# Patient Record
Sex: Male | Born: 1940 | Race: White | Hispanic: No | Smoking: Never smoker
Health system: Southern US, Community
[De-identification: ages and names within clinical notes are randomized; demographics above are authoritative.]

## PROBLEM LIST (undated history)

## (undated) DIAGNOSIS — I10 Essential (primary) hypertension: Secondary | ICD-10-CM

## (undated) DIAGNOSIS — M5030 Other cervical disc degeneration, unspecified cervical region: Secondary | ICD-10-CM

## (undated) DIAGNOSIS — Z955 Presence of coronary angioplasty implant and graft: Secondary | ICD-10-CM

## (undated) DIAGNOSIS — I471 Supraventricular tachycardia, unspecified: Secondary | ICD-10-CM

## (undated) DIAGNOSIS — R7303 Prediabetes: Secondary | ICD-10-CM

## (undated) DIAGNOSIS — E785 Hyperlipidemia, unspecified: Secondary | ICD-10-CM

## (undated) DIAGNOSIS — M109 Gout, unspecified: Secondary | ICD-10-CM

## (undated) DIAGNOSIS — G629 Polyneuropathy, unspecified: Secondary | ICD-10-CM

## (undated) DIAGNOSIS — M5135 Other intervertebral disc degeneration, thoracolumbar region: Secondary | ICD-10-CM

## (undated) DIAGNOSIS — K219 Gastro-esophageal reflux disease without esophagitis: Secondary | ICD-10-CM

## (undated) DIAGNOSIS — I6523 Occlusion and stenosis of bilateral carotid arteries: Secondary | ICD-10-CM

## (undated) DIAGNOSIS — R7989 Other specified abnormal findings of blood chemistry: Secondary | ICD-10-CM

## (undated) DIAGNOSIS — I2510 Atherosclerotic heart disease of native coronary artery without angina pectoris: Secondary | ICD-10-CM

## (undated) DIAGNOSIS — F0390 Unspecified dementia without behavioral disturbance: Secondary | ICD-10-CM

## (undated) DIAGNOSIS — G608 Other hereditary and idiopathic neuropathies: Secondary | ICD-10-CM

## (undated) DIAGNOSIS — I509 Heart failure, unspecified: Secondary | ICD-10-CM

## (undated) DIAGNOSIS — R270 Ataxia, unspecified: Secondary | ICD-10-CM

## (undated) DIAGNOSIS — Z79899 Other long term (current) drug therapy: Secondary | ICD-10-CM

## (undated) DIAGNOSIS — I5020 Unspecified systolic (congestive) heart failure: Secondary | ICD-10-CM

## (undated) DIAGNOSIS — N400 Enlarged prostate without lower urinary tract symptoms: Secondary | ICD-10-CM

## (undated) DIAGNOSIS — H409 Unspecified glaucoma: Secondary | ICD-10-CM

## (undated) DIAGNOSIS — I700 Atherosclerosis of aorta: Secondary | ICD-10-CM

## (undated) DIAGNOSIS — M1990 Unspecified osteoarthritis, unspecified site: Secondary | ICD-10-CM

## (undated) DIAGNOSIS — R600 Localized edema: Secondary | ICD-10-CM

## (undated) DIAGNOSIS — Z87828 Personal history of other (healed) physical injury and trauma: Secondary | ICD-10-CM

## (undated) DIAGNOSIS — K760 Fatty (change of) liver, not elsewhere classified: Secondary | ICD-10-CM

## (undated) DIAGNOSIS — Z789 Other specified health status: Secondary | ICD-10-CM

## (undated) DIAGNOSIS — K5790 Diverticulosis of intestine, part unspecified, without perforation or abscess without bleeding: Secondary | ICD-10-CM

## (undated) DIAGNOSIS — Z7901 Long term (current) use of anticoagulants: Secondary | ICD-10-CM

## (undated) DIAGNOSIS — I480 Paroxysmal atrial fibrillation: Secondary | ICD-10-CM

## (undated) DIAGNOSIS — K810 Acute cholecystitis: Secondary | ICD-10-CM

## (undated) DIAGNOSIS — I259 Chronic ischemic heart disease, unspecified: Secondary | ICD-10-CM

## (undated) HISTORY — PX: CORONARY STENT PLACEMENT: SHX1402

## (undated) HISTORY — PX: EYE SURGERY: SHX253

## (undated) HISTORY — PX: UPPER GI ENDOSCOPY: SHX6162

## (undated) HISTORY — PX: COLONOSCOPY: SHX174

## (undated) HISTORY — PX: MINOR HEMORRHOIDECTOMY: SHX6238

---

## 1968-05-01 DIAGNOSIS — Z87828 Personal history of other (healed) physical injury and trauma: Secondary | ICD-10-CM

## 1968-05-01 HISTORY — DX: Personal history of other (healed) physical injury and trauma: Z87.828

## 2003-03-05 ENCOUNTER — Other Ambulatory Visit: Payer: Self-pay

## 2004-05-01 DIAGNOSIS — Z9841 Cataract extraction status, right eye: Secondary | ICD-10-CM

## 2004-05-01 HISTORY — PX: CATARACT EXTRACTION: SUR2

## 2004-05-01 HISTORY — DX: Cataract extraction status, right eye: Z98.41

## 2005-03-09 ENCOUNTER — Ambulatory Visit: Payer: Self-pay | Admitting: Gastroenterology

## 2005-04-06 ENCOUNTER — Inpatient Hospital Stay: Payer: Self-pay | Admitting: Internal Medicine

## 2005-04-06 DIAGNOSIS — I2510 Atherosclerotic heart disease of native coronary artery without angina pectoris: Secondary | ICD-10-CM

## 2005-04-06 HISTORY — PX: LEFT HEART CATH AND CORONARY ANGIOGRAPHY: CATH118249

## 2005-04-06 HISTORY — DX: Atherosclerotic heart disease of native coronary artery without angina pectoris: I25.10

## 2005-04-07 ENCOUNTER — Other Ambulatory Visit: Payer: Self-pay

## 2005-04-07 HISTORY — PX: CORONARY ANGIOPLASTY WITH STENT PLACEMENT: SHX49

## 2005-05-01 HISTORY — PX: CORONARY STENT PLACEMENT: SHX1402

## 2005-06-06 ENCOUNTER — Encounter: Payer: Self-pay | Admitting: Internal Medicine

## 2006-04-17 ENCOUNTER — Ambulatory Visit: Payer: Self-pay | Admitting: Gastroenterology

## 2007-12-18 ENCOUNTER — Emergency Department: Payer: Self-pay | Admitting: Emergency Medicine

## 2007-12-18 ENCOUNTER — Other Ambulatory Visit: Payer: Self-pay

## 2009-07-15 ENCOUNTER — Ambulatory Visit: Payer: Self-pay | Admitting: Gastroenterology

## 2009-10-28 ENCOUNTER — Ambulatory Visit: Payer: Self-pay | Admitting: Internal Medicine

## 2009-12-21 ENCOUNTER — Observation Stay: Payer: Self-pay | Admitting: Internal Medicine

## 2009-12-29 ENCOUNTER — Ambulatory Visit: Payer: Self-pay | Admitting: Cardiovascular Disease

## 2010-02-28 ENCOUNTER — Ambulatory Visit: Payer: Self-pay

## 2010-03-21 ENCOUNTER — Ambulatory Visit: Payer: Self-pay | Admitting: Unknown Physician Specialty

## 2010-03-31 ENCOUNTER — Ambulatory Visit: Payer: Self-pay | Admitting: Unknown Physician Specialty

## 2010-03-31 ENCOUNTER — Emergency Department: Payer: Self-pay | Admitting: Emergency Medicine

## 2010-12-18 ENCOUNTER — Emergency Department: Payer: Self-pay | Admitting: Emergency Medicine

## 2010-12-22 ENCOUNTER — Emergency Department: Payer: Self-pay | Admitting: Emergency Medicine

## 2012-05-01 HISTORY — PX: JOINT REPLACEMENT: SHX530

## 2012-05-01 HISTORY — PX: TOTAL KNEE ARTHROPLASTY: SHX125

## 2013-01-06 ENCOUNTER — Emergency Department (HOSPITAL_BASED_OUTPATIENT_CLINIC_OR_DEPARTMENT_OTHER): Payer: Medicare Other

## 2013-01-06 ENCOUNTER — Emergency Department (HOSPITAL_BASED_OUTPATIENT_CLINIC_OR_DEPARTMENT_OTHER)
Admission: EM | Admit: 2013-01-06 | Discharge: 2013-01-06 | Disposition: A | Discharge status: Home / Self Care | Payer: Medicare Other | Attending: Emergency Medicine | Admitting: Emergency Medicine

## 2013-01-06 ENCOUNTER — Encounter (HOSPITAL_BASED_OUTPATIENT_CLINIC_OR_DEPARTMENT_OTHER): Payer: Self-pay | Admitting: *Deleted

## 2013-01-06 DIAGNOSIS — Y9389 Activity, other specified: Secondary | ICD-10-CM | POA: Insufficient documentation

## 2013-01-06 DIAGNOSIS — E785 Hyperlipidemia, unspecified: Secondary | ICD-10-CM | POA: Insufficient documentation

## 2013-01-06 DIAGNOSIS — Z7902 Long term (current) use of antithrombotics/antiplatelets: Secondary | ICD-10-CM | POA: Insufficient documentation

## 2013-01-06 DIAGNOSIS — Y9229 Other specified public building as the place of occurrence of the external cause: Secondary | ICD-10-CM | POA: Insufficient documentation

## 2013-01-06 DIAGNOSIS — I10 Essential (primary) hypertension: Secondary | ICD-10-CM | POA: Insufficient documentation

## 2013-01-06 DIAGNOSIS — Z79899 Other long term (current) drug therapy: Secondary | ICD-10-CM | POA: Insufficient documentation

## 2013-01-06 DIAGNOSIS — S0101XA Laceration without foreign body of scalp, initial encounter: Secondary | ICD-10-CM

## 2013-01-06 DIAGNOSIS — W1809XA Striking against other object with subsequent fall, initial encounter: Secondary | ICD-10-CM | POA: Insufficient documentation

## 2013-01-06 DIAGNOSIS — S0100XA Unspecified open wound of scalp, initial encounter: Secondary | ICD-10-CM | POA: Insufficient documentation

## 2013-01-06 DIAGNOSIS — Z9861 Coronary angioplasty status: Secondary | ICD-10-CM | POA: Insufficient documentation

## 2013-01-06 DIAGNOSIS — S0990XA Unspecified injury of head, initial encounter: Secondary | ICD-10-CM | POA: Insufficient documentation

## 2013-01-06 HISTORY — DX: Hyperlipidemia, unspecified: E78.5

## 2013-01-06 HISTORY — DX: Essential (primary) hypertension: I10

## 2013-01-06 NOTE — ED Provider Notes (Signed)
CSN: [id number]     Arrival date & time 01/06/13  1718 History   First MD Initiated Contact with Patient 01/06/13 1730     Chief Complaint  Patient presents with  . Head Laceration   (Consider location/radiation/quality/duration/timing/severity/associated sxs/prior Treatment) HPI Comments: Patient presents emergency department with chief complaint of head injury. Patient states that he slipped and fell earlier today, and hit his head on the ground. He denies loss consciousness. States that the fall happened because he slipped. He denies any syncopal episode or dizziness prior to fall. He states that he has a bad right knee, and thinks that he may have contributed to his fall. He endorses pain on the top of his head with a small laceration, as well as complaining of some sensation of swelling in his right forehead. He denies any blurred vision, or slurred speech. She denies any difficulty moving his arms or legs. He states that he is able to ambulate without difficulty. Last tetanus shot was 2 years ago.  The history is provided by the patient. No language interpreter was used.    Past Medical History  Diagnosis Date  . Hypertension   . Hyperlipidemia    Past Surgical History  Procedure Laterality Date  . Coronary stent placement     History reviewed. No pertinent family history. History  Substance Use Topics  . Smoking status: Never Smoker   . Smokeless tobacco: Not on file  . Alcohol Use: No    Review of Systems  All other systems reviewed and are negative.    Allergies  Review of patient's allergies indicates no known allergies.  Home Medications   Current Outpatient Rx  Name  Route  Sig  Dispense  Refill  . clopidogrel (PLAVIX) 300 MG TABS tablet   Oral   Take 300 mg by mouth once.         . rosuvastatin (CRESTOR) 40 MG tablet   Oral   Take 40 mg by mouth daily.          BP 159/101  Pulse 85  Temp(Src) 98.2 F (36.8 C) (Oral)  Resp 18  Ht 6' (1.829 m)   Wt 225 lb (102.059 kg)  BMI 30.51 kg/m2  SpO2 96% Physical Exam  Nursing note and vitals reviewed. Constitutional: He is oriented to person, place, and time. He appears well-developed and well-nourished.  HENT:  Head: Normocephalic and atraumatic.  Right Ear: External ear normal.  Left Ear: External ear normal.  Nose: Nose normal.  Mouth/Throat: Oropharynx is clear and moist. No oropharyngeal exudate.  No hemotympanum bilaterally, no Battle's sign, no raccoon's eyes.  Eyes: Conjunctivae and EOM are normal. Pupils are equal, round, and reactive to light. Right eye exhibits no discharge. Left eye exhibits no discharge. No scleral icterus.  Neck: Normal range of motion. Neck supple. No JVD present.  Cardiovascular: Normal rate, regular rhythm, normal heart sounds and intact distal pulses.  Exam reveals no gallop and no friction rub.   No murmur heard. Pulmonary/Chest: Effort normal and breath sounds normal. No respiratory distress. He has no wheezes. He has no rales. He exhibits no tenderness.  Abdominal: Soft. Bowel sounds are normal. He exhibits no distension and no mass. There is no tenderness. There is no rebound and no guarding.  Musculoskeletal: Normal range of motion. He exhibits no edema and no tenderness.  Neurological: He is alert and oriented to person, place, and time. He has normal reflexes.  CN 3-12 intact  Skin: Skin is warm and  dry.  4 cm laceration to the top of the scalp, bleeding is controlled, no evidence of foreign bodies, right forehead is remarkable for a 2 x 2 centimeter abrasion, no foreign bodies, nothing requiring repair on the forehead.  Psychiatric: He has a normal mood and affect. His behavior is normal. Judgment and thought content normal.    ED Course  Procedures (including critical care time) No results found for this or any previous visit. Ct Head Wo Contrast  01/06/2013   *RADIOLOGY REPORT*  Clinical Data: Pain post trauma  CT HEAD WITHOUT CONTRAST   Technique:  Contiguous axial images were obtained from the base of the skull through the vertex without contrast.  Comparison: None.  Findings:  There is mild diffuse atrophy.  There is no mass, hemorrhage, extra-axial fluid collection, or midline shift.  There is minimal small vessel disease in the centra semiovale bilaterally.  Gray-white compartments elsewhere are normal. Bony calvarium appears intact.  Mastoid air cells are clear.  IMPRESSION: Mild atrophy with minimal small vessel disease.  Study is otherwise unremarkable.   Original Report Authenticated By: Bretta Bang, M.D.    LACERATION REPAIR Performed by: Roxy Horseman Authorized by: Roxy Horseman Consent: Verbal consent obtained. Risks and benefits: risks, benefits and alternatives were discussed Consent given by: patient Patient identity confirmed: provided demographic data Prepped and Draped in normal sterile fashion Wound explored  Laceration Location: Scalp  Laceration Length: 4 cm  No Foreign Bodies seen or palpated  Anesthesia: local infiltration  Local anesthetic: lidocaine 2 % with epinephrine  Anesthetic total: 5 ml  Irrigation method: syringe Amount of cleaning: standard  Skin closure: 5-0 Prolene   Number of sutures: 10   Technique: Running   Patient tolerance: Patient tolerated the procedure well with no immediate complications.   MDM   1. Head injury, initial encounter   2. Scalp laceration, initial encounter    Patient with mechanical fall. As the patient is elderly, and on Plavix, will CT head. Pending negative image, will repair the laceration was sutured, as there is no hairline to hide staples. Tetanus is up-to-date. Patient states that his knee feels a little sore, but he is able to ambulate, and does not want an x-ray at this time.  Patient seen by and discussed with Dr. Jeraldine Loots. CT is negative. Laceration was repaired. Patient is stable and ready for discharge.   Roxy Horseman, PA-C 01/06/13 1845

## 2013-01-06 NOTE — ED Notes (Signed)
Coming out of hotel slipped fell lac to top of head pt is on plavix bleeding controlled on arrival denies loss of consciousness

## 2013-01-06 NOTE — ED Provider Notes (Signed)
  This was a shared visit with a mid-level provided (NP or PA).  Throughout the patient's course I was available for consultation/collaboration.  On my exam the patient was in no distress.  He was awake and alert, moving all to be spontaneously. I was available throughout and in presence during portions of the laceration repair.      Gerhard Munch, MD 01/06/13 2312

## 2013-03-05 ENCOUNTER — Ambulatory Visit: Payer: Self-pay | Admitting: General Practice

## 2013-03-05 LAB — URINALYSIS, COMPLETE
Bacteria: NONE SEEN
Bilirubin,UR: NEGATIVE
Blood: NEGATIVE
Ketone: NEGATIVE
Leukocyte Esterase: NEGATIVE
Squamous Epithelial: NONE SEEN
WBC UR: <1 /HPF (ref 0–5)

## 2013-03-05 LAB — BASIC METABOLIC PANEL
Anion Gap: 4 — ABNORMAL LOW (ref 7–16)
Creatinine: 0.87 mg/dL (ref 0.60–1.30)
EGFR (African American): >60
EGFR (Non-African Amer.): >60
Sodium: 130 mmol/L — ABNORMAL LOW (ref 136–145)

## 2013-03-05 LAB — CBC
MCH: 30.9 pg (ref 26.0–34.0)
MCV: 88 fL (ref 80–100)
Platelet: 207 10*3/uL (ref 150–440)
RBC: 4.84 10*6/uL (ref 4.40–5.90)

## 2013-03-05 LAB — MRSA PCR SCREENING

## 2013-03-05 LAB — PROTIME-INR
INR: 1
Prothrombin Time: 13.1 secs (ref 11.5–14.7)

## 2013-03-05 LAB — SEDIMENTATION RATE: Erythrocyte Sed Rate: 12 mm/hr (ref 0–20)

## 2013-03-06 LAB — URINE CULTURE

## 2013-03-19 ENCOUNTER — Inpatient Hospital Stay: Payer: Self-pay | Admitting: General Practice

## 2013-03-20 LAB — BASIC METABOLIC PANEL
BUN: 10 mg/dL (ref 7–18)
Calcium, Total: 8.3 mg/dL — ABNORMAL LOW (ref 8.5–10.1)
EGFR (African American): >60
EGFR (Non-African Amer.): >60
Glucose: 142 mg/dL — ABNORMAL HIGH (ref 65–99)
Osmolality: 264 (ref 275–301)
Potassium: 3.2 mmol/L — ABNORMAL LOW (ref 3.5–5.1)

## 2013-03-20 LAB — HEMOGLOBIN: HGB: 12.9 g/dL — ABNORMAL LOW (ref 13.0–18.0)

## 2013-03-21 LAB — BASIC METABOLIC PANEL
Anion Gap: 4 — ABNORMAL LOW (ref 7–16)
BUN: 9 mg/dL (ref 7–18)
Calcium, Total: 8.5 mg/dL (ref 8.5–10.1)
Co2: 28 mmol/L (ref 21–32)
EGFR (Non-African Amer.): >60
Osmolality: 266 (ref 275–301)
Potassium: 3.3 mmol/L — ABNORMAL LOW (ref 3.5–5.1)
Sodium: 133 mmol/L — ABNORMAL LOW (ref 136–145)

## 2013-03-23 ENCOUNTER — Encounter: Payer: Self-pay | Admitting: Internal Medicine

## 2013-03-28 ENCOUNTER — Ambulatory Visit: Payer: Self-pay | Admitting: Gerontology

## 2013-03-31 ENCOUNTER — Encounter: Payer: Self-pay | Admitting: Internal Medicine

## 2013-05-01 ENCOUNTER — Encounter: Payer: Self-pay | Admitting: Internal Medicine

## 2013-09-05 ENCOUNTER — Emergency Department: Payer: Self-pay | Admitting: Emergency Medicine

## 2013-12-18 DIAGNOSIS — I10 Essential (primary) hypertension: Secondary | ICD-10-CM | POA: Insufficient documentation

## 2013-12-18 DIAGNOSIS — E782 Mixed hyperlipidemia: Secondary | ICD-10-CM | POA: Insufficient documentation

## 2013-12-18 DIAGNOSIS — I2510 Atherosclerotic heart disease of native coronary artery without angina pectoris: Secondary | ICD-10-CM | POA: Insufficient documentation

## 2014-04-14 IMAGING — US US EXTREM LOW VENOUS*R*
1 series · 14 of 24 positions shown · non-contrast
Comparison: None.

CLINICAL DATA: Status post total knee replacement on 03/19/2013

EXAM:
Right LOWER EXTREMITY VENOUS DOPPLER ULTRASOUND
TECHNIQUE: Gray-scale sonography with graded compression, as well as color
Doppler and duplex ultrasound, were performed to evaluate the deep
venous system from the level of the common femoral vein through the
popliteal and proximal calf veins. Spectral Doppler was utilized to
evaluate flow at rest and with distal augmentation maneuvers.

[Series 1: us extrem low venous*right* · 0.13mm/px · 14 of 39 slices shown]
[im 1/39]
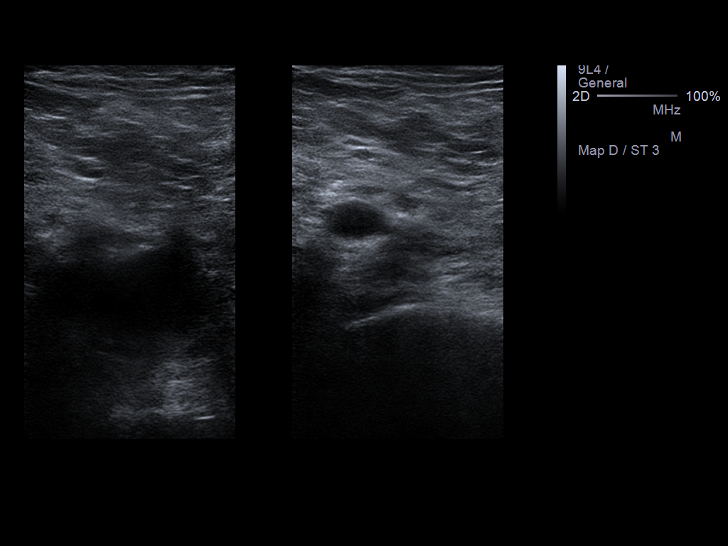
[im 4/39]
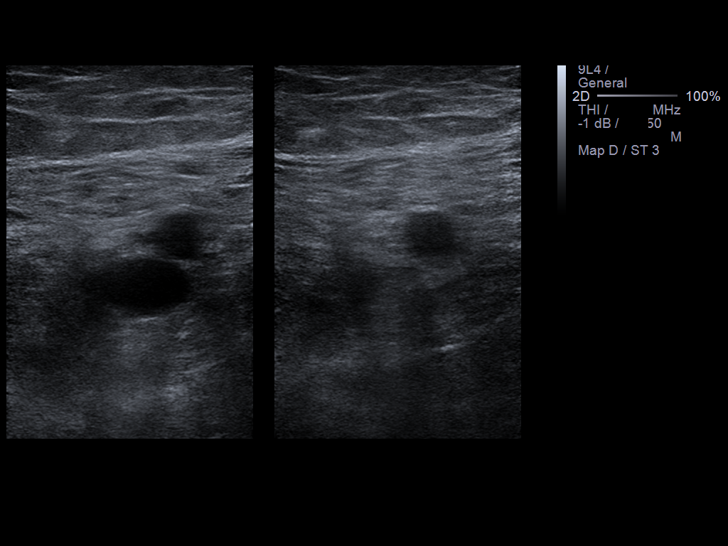
[im 7/39]
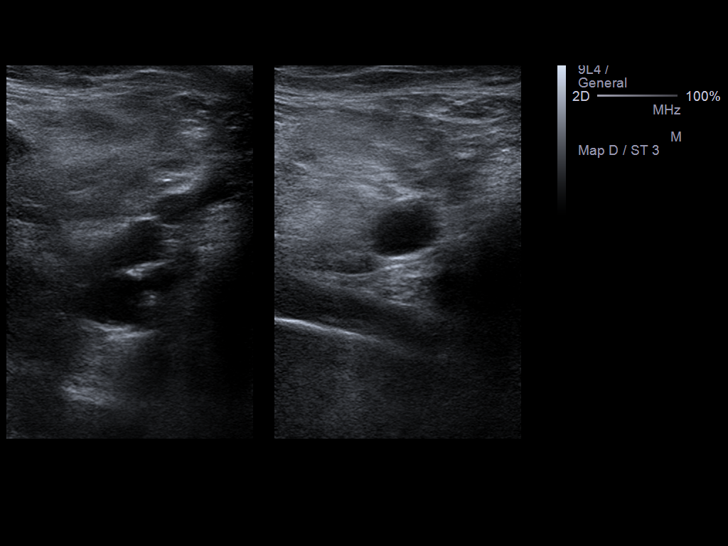
[im 10/39]
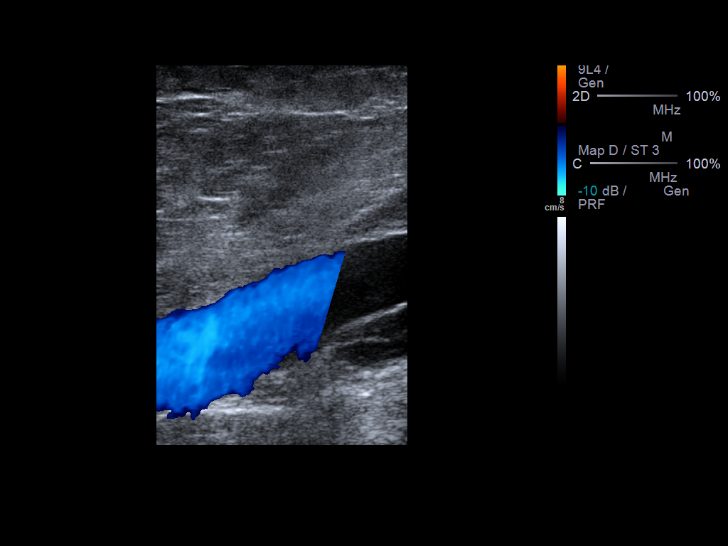
[im 12/39]
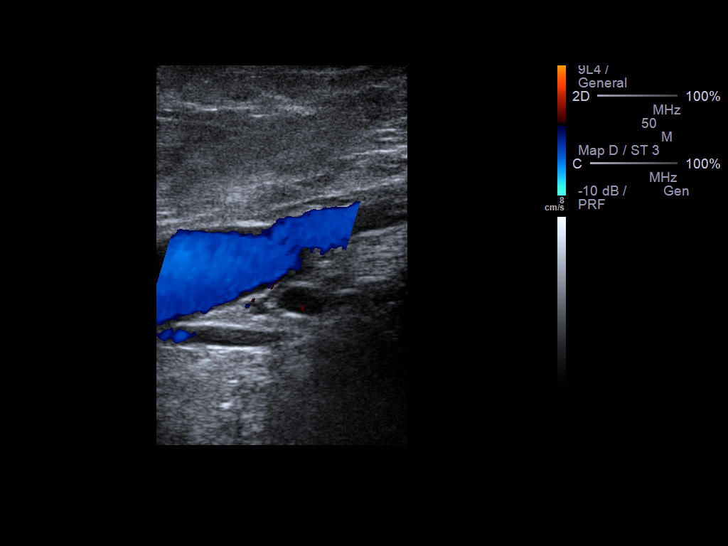
[im 15/39]
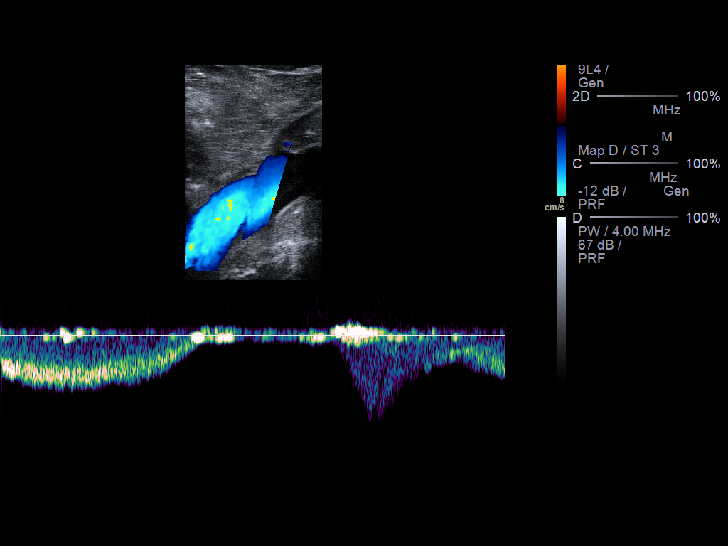
[im 19/39]
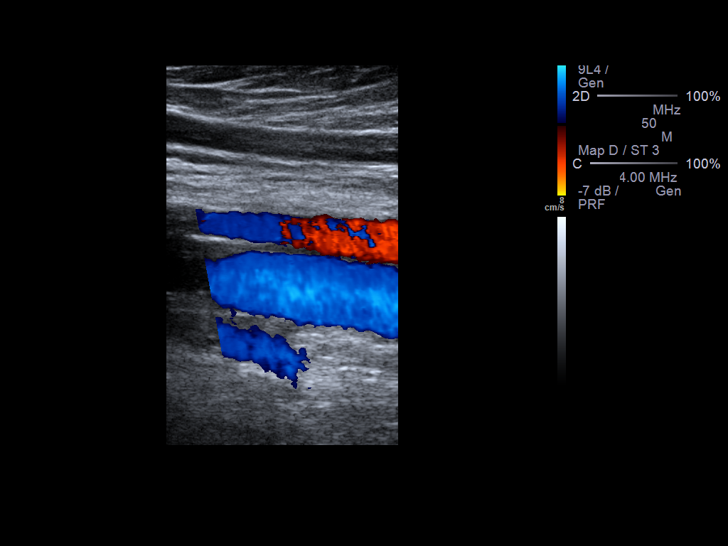
[im 20/39]
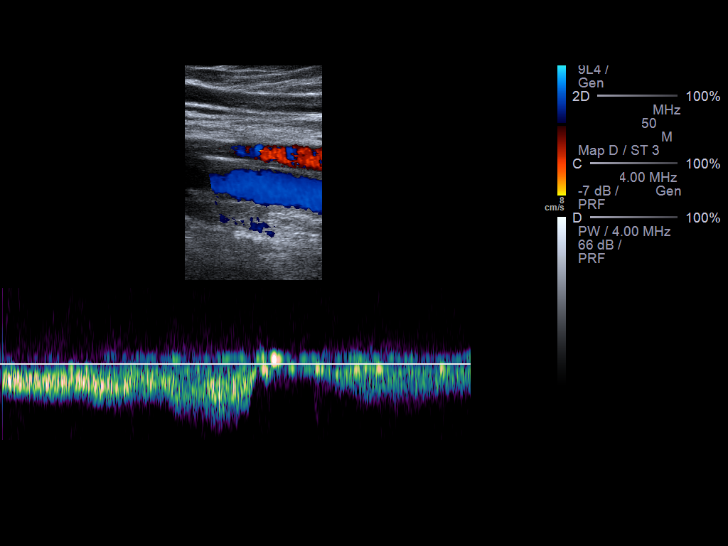
[im 24/39]
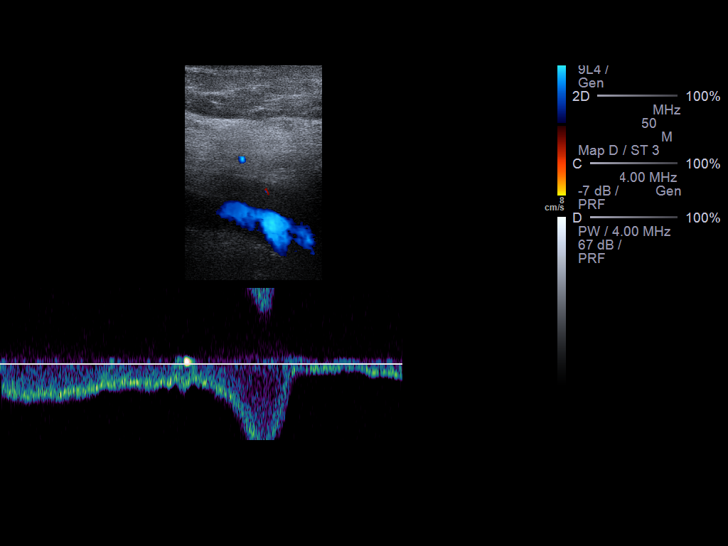
[im 27/39]
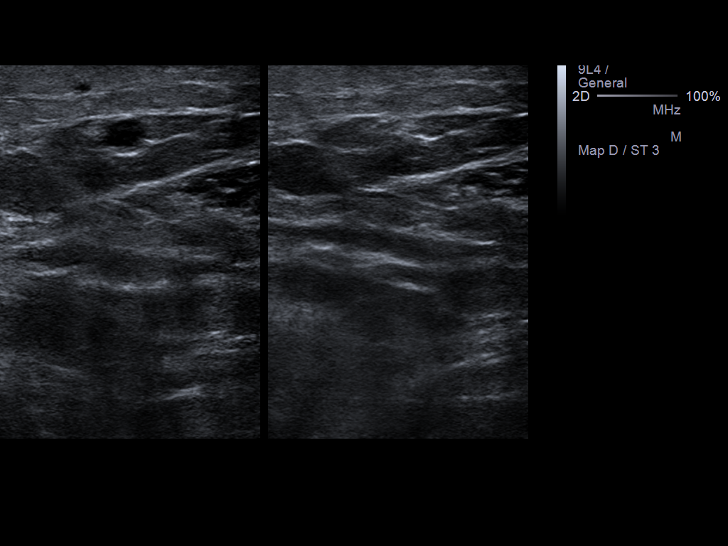
[im 30/39]
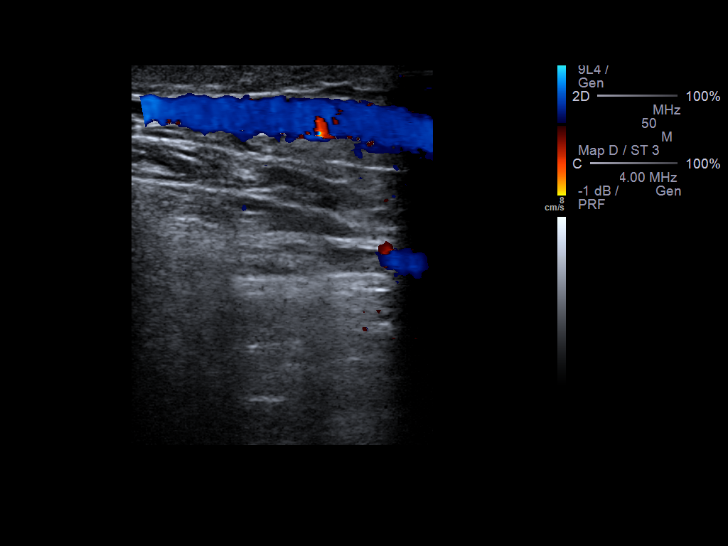
[im 32/39]
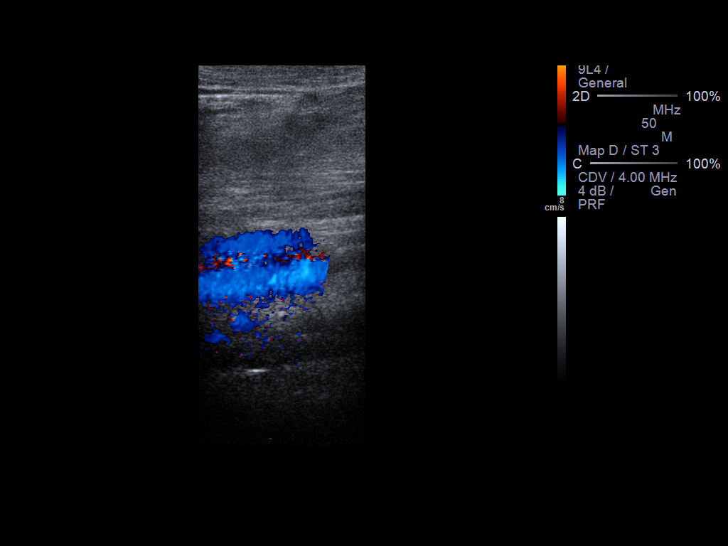
[im 35/39]
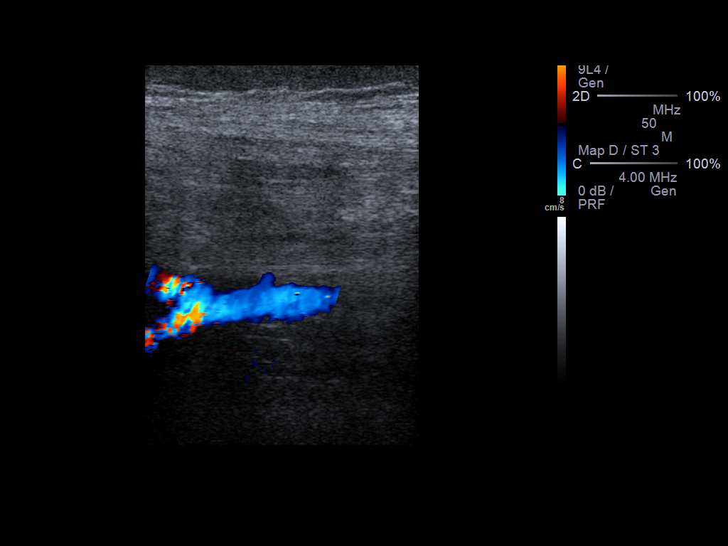
[im 39/39]
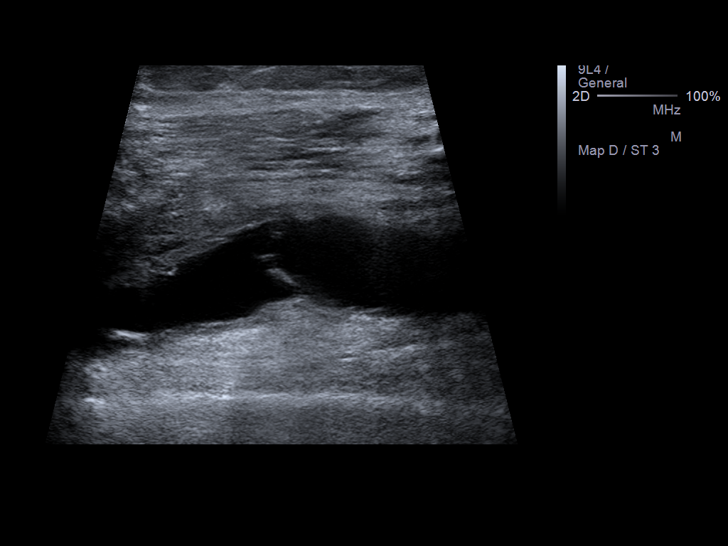

[14 of 24 positions shown; findings below may reference images not displayed]

FINDINGS: No deep venous thrombosis in the visualized right lower extremity.

Normal compressibility. Patent color Doppler flow. Satisfactory
spectral Doppler with respiratory variation and response to
augmentation.

The greater saphenous vein, where visualized, is patent and
compressible.

Suprapatellar fluid collection, likely postsurgical.

Subcutaneous edema throughout the visualized right lower extremity.
IMPRESSION: No deep venous thrombosis in the visualized right lower extremity.

## 2014-06-02 ENCOUNTER — Encounter: Payer: Self-pay | Admitting: Neurology

## 2014-06-12 ENCOUNTER — Ambulatory Visit: Payer: Self-pay | Admitting: Neurology

## 2014-06-30 ENCOUNTER — Encounter: Payer: Self-pay | Admitting: Neurology

## 2014-08-17 ENCOUNTER — Ambulatory Visit
Admit: 2014-08-17 | Disposition: A | Discharge status: Home / Self Care | Payer: Self-pay | Attending: Gastroenterology | Admitting: Gastroenterology

## 2014-08-21 NOTE — Op Note (Signed)
PATIENT NAME:  Shawn Hart, Shawn Hart MR#:  [id number] DATE OF BIRTH:  1941/01/20  DATE OF PROCEDURE:  03/19/2013  PREOPERATIVE DIAGNOSIS: Degenerative arthrosis of the right knee.   POSTOPERATIVE DIAGNOSIS: Degenerative arthrosis of the right knee.   PROCEDURE PERFORMED: Right total knee arthroplasty using computer-assisted navigation.   SURGEON: Skip Estimable, M.D.   ASSISTANT: Vance Peper, PA (required to maintain retraction throughout the procedure).   ANESTHESIA: Spinal.   ESTIMATED BLOOD LOSS: 100 mL.   FLUIDS REPLACED: 1800 mL of crystalloid.   TOURNIQUET TIME: 98 minutes.   DRAINS: Two medium drains to reinfusion system.  SOFT TISSUE RELEASES: Anterior cruciate ligament, posterior cruciate ligament, deep medial collateral ligament and patellofemoral ligament.  IMPLANTS UTILIZED: DePuy PFC Sigma size 5 posterior stabilized femoral component (cemented), size 5 MBT tibial component (cemented), 38 mm 3-peg oval dome patella (cemented), and a 10 mm stabilized rotating platform polyethylene insert.  Gentamicin cement was utilized.  INDICATIONS FOR SURGERY: The patient is a 74 year old gentleman, who has been seen for complaints of progressive right knee pain. X-rays demonstrated severe degenerative changes in tricompartmental fashion. After discussion of the risks and benefits of surgical intervention, the patient expressed understanding of the risks and benefits and agreed with plans for surgical intervention.   PROCEDURE IN DETAIL: The patient was brought in the operating room, then after adequate spinal anesthesia was achieved, a tourniquet was placed on the patient's upper right thigh. The patient's right knee and leg were cleaned and prepped with alcohol and DuraPrep and draped in the usual sterile fashion. A "timeout" was performed as per usual protocol. The right lower extremity was exsanguinated using an Esmarch and the tourniquet was inflated to 300 mmHg. An anterior longitudinal  incision was made followed by a standard mid vastus approach. A large effusion was evacuated. The deep fibers of the medial collateral ligament were elevated in a subperiosteal fashion off the medial flare of the tibia so as to maintain a continuous soft tissue sleeve. The patella was subluxed laterally and the patellofemoral ligament was incised. Inspection of the knee demonstrated severe degenerative change in a tricompartmental fashion with full-thickness loss of articular cartilage. Osteophytes were debrided using a rongeur. Anterior and posterior cruciate ligaments were excised. Two 4.0 mm Schanz pins were inserted into the femur and into the tibia for attachment of the ray of trackers used for computer-assisted navigation. Hip center was identified using circumduction technique. Distal landmarks were mapped using the computer. The distal femur and proximal tibia were mapped using the computer. The distal femoral cutting guide was positioned using computer-assisted navigation so as to achieve a 5 degree distal valgus cut. Cut was performed and verified using the computer. Distal femur was sized and it was felt that a size 5 femoral component was appropriate. A size 5 cutting guide was positioned and the anterior cut was performed and verified using the computer. This was followed by completion of the posterior and chamfer cuts. Femoral cutting guide for central box was then positioned and the central box cut was performed.   Attention was then directed to the proximal tibia. Medial and lateral menisci were excised. The extramedullary tibial cutting guide was positioned using computer-assisted navigation so as to achieve 0 degree varus valgus alignment and 0 degree posterior slope. Cut was performed and verified using the computer. The proximal tibia was sized and it was felt that a size 5 tibial tray was appropriate. Tibial and femoral trials were inserted followed by insertion of a 10 mm polyethylene  trial.  This allowed for excellent mediolateral soft tissue balancing both in full extension and in flexion. Finally, the patella was cut and prepared so as to accommodate a 38 mm 3-peg oval dome patella. Patellar trial was placed and the knee was placed through a range of motion with excellent patellar tracking appreciated. The femoral trial was removed after debridement of the posterior osteophytes. A central post hole for the tibial component was reamed followed by insertion of a keel punch. Tibial trial was then removed. The cut surfaces of bone were irrigated with copious amounts of normal saline with antibiotic solution using pulsatile lavage were then suctioned dry. Polymethyl methacrylate cement with gentamicin was prepared in the usual fashion using a vacuum mixer. Cement was applied to the cut surface of the proximal tibia as well as along the undersurface of a size 5 MBT tibial component. The tibial component was positioned and impacted into place. Excess cement was removed using freer elevators. Cement was then applied to the cut surface of the femur as well as along the posterior flanges of a 5 five posterior stabilized femoral component. Femoral component was positioned and impacted into place. Excess cement was removed using freer elevators. A 10 mm polyethylene trial was inserted and the knee was brought in full extension with steady axial compression applied. Finally, cement was applied to the backside of a 38 mm 3-peg oval dome patella, and the patellar component was positioned and patellar clamp applied. Excess cement was removed using freer elevators.   After adequate curing of the cement, the tourniquet was deflated after total tourniquet time of 98 minutes. Hemostasis was achieved using electrocautery. The knee was irrigated with copious amounts of normal saline with antibiotic solution using pulsatile lavage and then suctioned dry. The knee was inspected for any residual cement debris. 20 mL of 1.3%  Exparel in 40 mL of normal saline was injected along the posterior recess, medial and lateral gutters, and along the arthrotomy site. A 10 mm stabilized rotating platform polyethylene insert was inserted and the knee was placed through a range of motion with excellent patellar tracking appreciated and excellent mediolateral soft tissue balancing noted both in flexion and in extension. Two medium drains were placed in the wound bed and brought out through a separate stab incision to be attached to a reinfusion system. The medial parapatellar portion of the incision was reapproximated using interrupted sutures of #1 Vicryl. Then, 30 mL of 0.25% Marcaine with epinephrine was injected into the subcutaneous tissue along the incision site. The subcutaneous tissue was then approximated in layers using first #0 Vicryl followed by 2-0 Vicryl. Skin was closed with skin staples. A sterile dressing was applied. The patient tolerated the procedure well. He was transported to the recovery room in stable condition.  ____________________________ Laurice Record. Holley Bouche., MD [email or web address] D: 03/20/2013 06:30:33 ET T: 03/20/2013 06:42:43 ET JOB#: [id number]  cc: Laurice Record. Holley Bouche., MD, <Dictator> Laurice Record Holley Bouche MD ELECTRONICALLY SIGNED 03/20/2013 18:53

## 2014-08-21 NOTE — Discharge Summary (Signed)
PATIENT NAME:  Shawn Hart, Shawn Hart MR#:  [id number] DATE OF BIRTH:  03/26/1941  DATE OF ADMISSION:  03/19/2013 DATE OF DISCHARGE:  03/22/2013  ADMITTING DIAGNOSIS: Degenerative arthrosis of the right knee.   DISCHARGE DIAGNOSIS: Degenerative arthrosis of the right knee.   OPERATION: On 03/19/2013, the patient had a right total knee arthroplasty using computer-aided navigation.   SURGEON: Skip Estimable, M.D.   ASSISTANT: Vance Peper, PA.   ANESTHESIA: Spinal.   ESTIMATED BLOOD LOSS: 100 mL.  FLUIDS REPLACED: 1800 mL of crystalloid.   TOURNIQUET TIME: 98 minutes.   DRAINS: Two medium drains to reinfusion system.   IMPLANTS USED: DePuy PFC Sigma size 5 posterior stabilized femoral component cemented, size 5 MBT tibial component, cemented, 38 mm three-pegged oval dome patella that was cemented, a 10 mm stabilized rotating platform polyethylene insert, and gentamicin cement was utilized.   HISTORY: The patient is a 74 year old male who presented for an upcoming total knee replacement of the right knee. The patient has had a long history of knee pain that has been refractory to conservative treatment. The patient has been on anti-inflammatories and activities of daily living with no relief. The patient has tried cortisone injections and Synvisc with no relief.   PHYSICAL EXAMINATION: GENERAL: Alert male with an antalgic gait and a varus thrust to the right knee.  LUNGS: Clear to auscultation. CARDIOVASCULAR: Normal rate and rhythm.  MUSCULOSKELETAL: In regard to the right knee, the patient has positive soft tissue swelling with medial joint line tenderness. There is varus alignment. The patient's range of motion with full extension to 116 degrees flexion.   HOSPITAL COURSE: After initial admission on 03/19/2013, the patient was brought to the orthopedic floor. On postop day 1, the patient's hemoglobin was at 12.9 and dropped down to 12.5 on postop day 2. The patient did have a potassium level  of 3.2 initially after surgery which came up to 3.3 after receiving potassium t.i.d. The patient was continued on this for a total of 3 days. The patient worked with physical therapy, initially bed to chair, and progressed up to ambulating around the nurse's station, doing well with physical therapy. The patient had no other complications.   CONDITION AT DISCHARGE: Stable.   DISPOSITION: The patient was sent to rehab.   DISCHARGE INSTRUCTIONS: The patient will follow up with [facility] orthopedics to see Vance Peper on 04/03/2013. The patient will do weight bear as tolerated. The patient will use 1 or 2 pillows under the foot and use thigh-high TED hose on both legs to be removed 1 hour every 8 hour shift. The patient will elevate the heels off the bed. The patient will do incentive spirometer and be encouraged to do cough and deep breathing. The patient will do a diet that is regular. The patient will use Polar Care to decrease swelling and try to keep the dressing clean and dry, to leave it on without getting it wet. The patient will call the clinic or the rehab center will call the clinic if there is any bright red bleeding or calf pain or bowel or bladder difficulty. The patient will do physical therapy and occupational therapy per protocol.  DISCHARGE MEDICATIONS: 1.  Crestor 10 mg 1 tablet daily. 2.  Plavix 75 mg 1 tablet daily. 3.  Fish oil 1000 mg 1 tablet daily. 4.  Centrum Silver 1 tablet daily. 5.  Ocuvite 1 tablet daily.  6.  Vitamin D3 1000 international units daily. 7.  Hydrochlorothiazide/losartan 12.5/100 mg 1  tablet daily.  8.  Metronidazole 1% topically to affected area p.r.n. 9.   Cepacol lozenges once a day.  10.  Phillips cramp free oral tablet 500 mg 2 tablets daily at nighttime.  11.  Dulcolax suppository 1 tablet daily. 12.  Nitrostat 0.4 sublingual q. 5 minutes as needed for chest pain. 13.  Benadryl 25 mg 1 tablet daily.  14.  QlearQuil day and night 10 mg for nasal  congestion p.r.n.  15.  Coricidin cold and flu 325 mg 1 tablet q. 6 hours as needed. 16.  Cepacol lozenges 2 a day. 17.  TUMS extra-strength 3 a day. 18.  Tylenol multisystem q. 4 hours using 30 mL. 19.  Timolol 0.25 mg ophthalmic solution 1 drop in the right eye daily. 20.  Co-Q10 100 mg 1 capsule b.i.d. 21.  Celebrex 200 mg 1 tablet b.i.d.  22.  Vitamin B complex 1 tablet daily. 23.  Brimonidine ophthalmic 0.15% ophthalmic solution 1 drop in each eye daily.  24.  Ferrous sulfate 325 mg 1 tablet daily. 25.  Probiotic 1 capsule daily. 26.  Nexium 20 mg 1 tablet b.i.d. 27.  Claritin every 12 hours p.r.n.  28.  Multivitamin 1 tablet daily. 29.  Vitamin B12 daily.  30.  Lovenox 30 mg 1 ejection for 14 days then discontinue.  31.  Oxycodone 5 mg 1 tablet q. 4 hours p.r.n. for severe pain. 32.  Tramadol 50 mg 1 tablet q. 4 hours for mild pain.  33.  Tylenol 500 mg 1 tablet p.o. q. 4 hours for pain or fever greater than 100.4. 34.  Losartan 100 mg p.o. daily.  35.  Magnesium hydroxide 30 mL b.i.d.  36.  Milk of magnesia 30 mL q. 6 hours p.r.n.  37.  Hydroxide 400 mg for indigestion.  38.  Senokot-S 1 tablet b.i.d.  39.  Potassium chloride 20 mEq q. 8 hours x2 days. 40.  Lactobacillus 1 capsule daily. ____________________________ Lenna Sciara. Reche Dixon, [place] [id number] D: 03/21/2013 07:49:14 ET T: 03/21/2013 08:11:19 ET JOB#: [id number]  cc: J. Reche Dixon, [place], <Dictator> J Karlis Cregg [facility] [place] ELECTRONICALLY SIGNED 04/06/2013 6:42

## 2015-04-12 DIAGNOSIS — Z96651 Presence of right artificial knee joint: Secondary | ICD-10-CM | POA: Insufficient documentation

## 2015-12-22 DIAGNOSIS — I6523 Occlusion and stenosis of bilateral carotid arteries: Secondary | ICD-10-CM | POA: Insufficient documentation

## 2016-02-25 ENCOUNTER — Encounter: Payer: Self-pay | Admitting: *Deleted

## 2016-02-25 ENCOUNTER — Emergency Department: Payer: Medicare Other

## 2016-02-25 ENCOUNTER — Emergency Department
Admission: EM | Admit: 2016-02-25 | Discharge: 2016-02-25 | Disposition: A | Discharge status: Home / Self Care | Payer: Medicare Other | Attending: Emergency Medicine | Admitting: Emergency Medicine

## 2016-02-25 DIAGNOSIS — Y999 Unspecified external cause status: Secondary | ICD-10-CM | POA: Insufficient documentation

## 2016-02-25 DIAGNOSIS — M545 Low back pain: Secondary | ICD-10-CM | POA: Diagnosis present

## 2016-02-25 DIAGNOSIS — M5440 Lumbago with sciatica, unspecified side: Secondary | ICD-10-CM | POA: Diagnosis not present

## 2016-02-25 DIAGNOSIS — Y92410 Unspecified street and highway as the place of occurrence of the external cause: Secondary | ICD-10-CM | POA: Diagnosis not present

## 2016-02-25 DIAGNOSIS — Y9389 Activity, other specified: Secondary | ICD-10-CM | POA: Diagnosis not present

## 2016-02-25 DIAGNOSIS — I10 Essential (primary) hypertension: Secondary | ICD-10-CM | POA: Diagnosis not present

## 2016-02-25 DIAGNOSIS — Z79899 Other long term (current) drug therapy: Secondary | ICD-10-CM | POA: Insufficient documentation

## 2016-02-25 MED ORDER — OXYCODONE-ACETAMINOPHEN 5-325 MG PO TABS: 2.0000 | ORAL_TABLET | Freq: Four times a day (QID) | ORAL | 0 refills | Status: DC | PRN

## 2016-02-25 MED ORDER — PREDNISONE 10 MG (21) PO TBPK: ORAL_TABLET | ORAL | 0 refills | Status: DC
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
## 2016-02-25 NOTE — ED Notes (Signed)
MD at bedside. 
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
## 2016-07-06 DIAGNOSIS — E669 Obesity, unspecified: Secondary | ICD-10-CM | POA: Insufficient documentation

## 2016-12-21 ENCOUNTER — Emergency Department: Payer: Medicare HMO

## 2016-12-21 ENCOUNTER — Encounter: Payer: Self-pay | Admitting: Emergency Medicine

## 2016-12-21 ENCOUNTER — Emergency Department
Admission: EM | Admit: 2016-12-21 | Discharge: 2016-12-21 | Disposition: A | Discharge status: Home / Self Care | Payer: Medicare HMO | Attending: Emergency Medicine | Admitting: Emergency Medicine

## 2016-12-21 DIAGNOSIS — Z79899 Other long term (current) drug therapy: Secondary | ICD-10-CM | POA: Diagnosis not present

## 2016-12-21 DIAGNOSIS — I10 Essential (primary) hypertension: Secondary | ICD-10-CM | POA: Diagnosis not present

## 2016-12-21 DIAGNOSIS — Z955 Presence of coronary angioplasty implant and graft: Secondary | ICD-10-CM | POA: Insufficient documentation

## 2016-12-21 HISTORY — DX: Polyneuropathy, unspecified: G62.9

## 2016-12-21 HISTORY — DX: Personal history of other (healed) physical injury and trauma: Z87.828

## 2016-12-21 HISTORY — DX: Ataxia, unspecified: R27.0

## 2016-12-21 LAB — BASIC METABOLIC PANEL
Anion gap: 9 (ref 5–15)
BUN: 12 mg/dL (ref 6–20)
CHLORIDE: 97 mmol/L — AB (ref 101–111)
CO2: 24 mmol/L (ref 22–32)
CREATININE: 0.87 mg/dL (ref 0.61–1.24)
Calcium: 9.3 mg/dL (ref 8.9–10.3)
GFR calc Af Amer: >60 mL/min (ref >60)
GFR calc non Af Amer: >60 mL/min (ref >60)
Glucose, Bld: 98 mg/dL (ref 65–99)
POTASSIUM: 3.8 mmol/L (ref 3.5–5.1)
Sodium: 130 mmol/L — ABNORMAL LOW (ref 135–145)

## 2016-12-21 LAB — CBC
HCT: 46.7 % (ref 40.0–52.0)
HEMOGLOBIN: 16.3 g/dL (ref 13.0–18.0)
MCH: 30.9 pg (ref 26.0–34.0)
MCHC: 35 g/dL (ref 32.0–36.0)
MCV: 88.4 fL (ref 80.0–100.0)
Platelets: 224 10*3/uL (ref 150–440)
RBC: 5.28 MIL/uL (ref 4.40–5.90)
RDW: 13.1 % (ref 11.5–14.5)
WBC: 9.8 10*3/uL (ref 3.8–10.6)

## 2016-12-21 LAB — TROPONIN I

## 2016-12-21 MED ORDER — AMLODIPINE BESYLATE 5 MG PO TABS
5.0000 mg | ORAL_TABLET | Freq: Once | ORAL | Status: AC
  Administered 2016-12-21: 5 mg via ORAL
  Filled 2016-12-21: qty 1

## 2016-12-21 MED ORDER — AMLODIPINE BESYLATE 5 MG PO TABS: 5.0000 mg | ORAL_TABLET | Freq: Every day | ORAL | 2 refills | Status: DC

## 2016-12-21 NOTE — ED Notes (Signed)
Waiting to go for MRI
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
## 2016-12-21 NOTE — ED Notes (Signed)
Patient up to use restroom 

## 2016-12-21 NOTE — ED Notes (Addendum)
Patient reports episode of slurred speech around 1pm today.  Patient reports history of the same, but states, "I've never gotten it checked out before, it's never lasted very long."  Patient reports today's episode lasted approx. 3-4 minutes.

## 2016-12-21 NOTE — ED Triage Notes (Signed)
Pt brought over from [facility] for evaluation of elevated blood pressure. Pt reports taking losartan for BP. Pt states checked his BP today multiple times and it was elevated each time, pt then had EMS check it and it was elevated and they recommended he go to [facility]. Pt reports some nausea and left shoulder pain. Denies chest pain. Pt alert and oriented, bilateral strong hand grips, facial symmetry intact, no arm drift noted. Pt reports has been evaluated by PCP for ongoing problems with balance for over one year. Pt states PCP told him balance issues were related to BP.

## 2016-12-21 NOTE — ED Notes (Signed)

## 2016-12-21 NOTE — Discharge Instructions (Signed)
As we discussed, there is no evidence of stroke or other acute abnormality on your brain MRI.  We discussed your case with Dr. Nehemiah Massed including your elevated blood pressure, and he recommended that you take amlodipine 5 mg by mouth daily in addition to your current medications.  We gave you a first dose tonight in the Emergency Department.  Dr. Nehemiah Massed will see you in clinic on Monday at 3:00pm to follow up.  In the meantime, take all your regular medications, and return to the Emergency Department with new or worsening symptoms that concern you.

## 2017-01-10 ENCOUNTER — Ambulatory Visit: Payer: Medicare HMO | Admitting: Physical Therapy

## 2017-01-16 ENCOUNTER — Ambulatory Visit: Payer: Medicare HMO | Admitting: Physical Therapy

## 2017-01-18 ENCOUNTER — Ambulatory Visit: Payer: Medicare HMO | Attending: Neurology | Admitting: Physical Therapy

## 2017-01-18 ENCOUNTER — Encounter: Payer: Self-pay | Admitting: Physical Therapy

## 2017-01-18 DIAGNOSIS — M6281 Muscle weakness (generalized): Secondary | ICD-10-CM | POA: Insufficient documentation

## 2017-01-18 DIAGNOSIS — R262 Difficulty in walking, not elsewhere classified: Secondary | ICD-10-CM | POA: Diagnosis present

## 2017-01-18 DIAGNOSIS — R2681 Unsteadiness on feet: Secondary | ICD-10-CM | POA: Diagnosis not present

## 2017-01-18 NOTE — Patient Instructions (Signed)
Perform all with eyes opened:   Feet Apart, Varied Arm Positions - Eyes Closed   Stand in corner (try to not lean on wall) with chair out in front of you for safety. Stand with feet shoulder width apart and arms by side. Close eyes and visualize upright position. Hold __10__ seconds. The open eyes and hold for 10 sec. Repeat alternating between eyes open/closed; As this gets easier, try with feet close together.  Repeat __5__ times per session. Do _2-3___ sessions per day.  Copyright  VHI. All rights reserved.  Feet Apart, Head Motion - Eyes Closed  Stand in corner (try to not lean on wall) with chair out in front of you for safety. With eyes closed and feet shoulder width apart, move head slowly, up and down 5 times, then open eyes and try to find your balance. Then try with eyes closed and move head side/side x5 times. Repeat _2___ times each direction per session. Do __2__ sessions per day.  Copyright  VHI. All rights reserved.  Feet Apart, Arm Motion - Eyes Closed  Stand in corner (try to not lean on wall) with chair out in front of you for safety. With eyes closed and feet shoulder width apart, move arms up and down: to front. Try to keep feet still and keep a good posture as you move your arms.  Repeat __10__ times per session. Do __2__ sessions per day.  Copyright  VHI. All rights reserved.  Feet Heel-Toe "Tandem", Varied Arm Positions - Eyes Closed  Stand in corner (try to not lean on wall) with chair out in front of you for safety. Stand with right foot directly in front of the other and arms out. Close eyes and visualize upright position. Hold_5-10__ seconds. Repeat _2-3___ times per session. Do __2__ sessions per day.  Copyright  VHI. All rights reserved.    To increase difficulty of any exercise, try standing on couch cushion or pillow for uneven surface.  Make sure that you practice good safety with having objects nearby to grab ahold of if you start to loose  your balance.  If you start to get dizzy, please stop and take a rest break.
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
## 2017-01-23 ENCOUNTER — Ambulatory Visit: Payer: Medicare HMO | Admitting: Physical Therapy

## 2017-01-25 ENCOUNTER — Ambulatory Visit: Payer: Medicare HMO | Admitting: Physical Therapy

## 2017-01-30 ENCOUNTER — Ambulatory Visit: Payer: No Typology Code available for payment source | Admitting: Physical Therapy

## 2017-02-01 ENCOUNTER — Ambulatory Visit: Payer: No Typology Code available for payment source | Admitting: Physical Therapy

## 2017-02-06 ENCOUNTER — Ambulatory Visit: Payer: No Typology Code available for payment source | Admitting: Physical Therapy

## 2017-02-08 ENCOUNTER — Ambulatory Visit: Payer: No Typology Code available for payment source | Admitting: Physical Therapy

## 2017-02-13 ENCOUNTER — Ambulatory Visit: Payer: No Typology Code available for payment source | Admitting: Physical Therapy

## 2017-02-15 ENCOUNTER — Ambulatory Visit: Payer: No Typology Code available for payment source | Admitting: Physical Therapy

## 2017-02-20 ENCOUNTER — Ambulatory Visit: Payer: No Typology Code available for payment source | Admitting: Physical Therapy

## 2017-02-22 ENCOUNTER — Ambulatory Visit: Payer: No Typology Code available for payment source | Admitting: Physical Therapy

## 2017-02-27 ENCOUNTER — Ambulatory Visit: Payer: Medicare HMO | Admitting: Physical Therapy

## 2017-03-01 ENCOUNTER — Ambulatory Visit: Payer: Medicare HMO | Admitting: Physical Therapy

## 2017-11-21 DIAGNOSIS — G603 Idiopathic progressive neuropathy: Secondary | ICD-10-CM | POA: Insufficient documentation

## 2018-01-28 DIAGNOSIS — R600 Localized edema: Secondary | ICD-10-CM | POA: Insufficient documentation

## 2018-06-02 ENCOUNTER — Emergency Department: Payer: Medicare HMO

## 2018-06-02 ENCOUNTER — Emergency Department
Admission: EM | Admit: 2018-06-02 | Discharge: 2018-06-02 | Disposition: A | Discharge status: Home / Self Care | Payer: Medicare HMO | Attending: Emergency Medicine | Admitting: Emergency Medicine

## 2018-06-02 DIAGNOSIS — I10 Essential (primary) hypertension: Secondary | ICD-10-CM | POA: Diagnosis not present

## 2018-06-02 DIAGNOSIS — Y9301 Activity, walking, marching and hiking: Secondary | ICD-10-CM | POA: Insufficient documentation

## 2018-06-02 DIAGNOSIS — Z7902 Long term (current) use of antithrombotics/antiplatelets: Secondary | ICD-10-CM | POA: Diagnosis not present

## 2018-06-02 DIAGNOSIS — Y999 Unspecified external cause status: Secondary | ICD-10-CM | POA: Insufficient documentation

## 2018-06-02 DIAGNOSIS — T148XXA Other injury of unspecified body region, initial encounter: Secondary | ICD-10-CM

## 2018-06-02 DIAGNOSIS — S0081XA Abrasion of other part of head, initial encounter: Secondary | ICD-10-CM | POA: Insufficient documentation

## 2018-06-02 DIAGNOSIS — W19XXXA Unspecified fall, initial encounter: Secondary | ICD-10-CM

## 2018-06-02 DIAGNOSIS — Y929 Unspecified place or not applicable: Secondary | ICD-10-CM | POA: Diagnosis not present

## 2018-06-02 DIAGNOSIS — W010XXA Fall on same level from slipping, tripping and stumbling without subsequent striking against object, initial encounter: Secondary | ICD-10-CM | POA: Insufficient documentation

## 2018-06-02 DIAGNOSIS — M8958 Osteolysis, other site: Secondary | ICD-10-CM | POA: Diagnosis not present

## 2018-06-02 DIAGNOSIS — S0990XA Unspecified injury of head, initial encounter: Secondary | ICD-10-CM | POA: Diagnosis present

## 2018-06-02 DIAGNOSIS — Z79899 Other long term (current) drug therapy: Secondary | ICD-10-CM | POA: Diagnosis not present

## 2018-06-02 DIAGNOSIS — M899 Disorder of bone, unspecified: Secondary | ICD-10-CM

## 2018-06-02 NOTE — ED Triage Notes (Signed)
Patient presents to the ED after falling face first onto cement this morning.  Patient denies losing consciousness.  Patient states he tripped on the curb.  Patient is also complaining of pain to his left lower rib area.  Area is tender and was worse when it rains.  Patient states pain is relieved with arthritis medication.  Patient states, "I want to get it checked out too, while I'm here."  Patient has abrasions to his nose and forehead.  Patient reports that he takes plavix.

## 2018-06-02 NOTE — ED Notes (Signed)
Unable to obtain E-sig due to  No E-sig pad available. Paper copy obtained and placed in chart.

## 2018-06-02 NOTE — ED Notes (Signed)
Pt moved to hallbed. This RN apologized and explained reasoning. Pt states understanding. Pt moved to 1H without incident. Awaiting DG Ribs at this time. Pt states understanding at this time.

## 2018-06-02 NOTE — ED Notes (Signed)
NAD noted at time of D/C. Pt denies questions or concerns. Pt ambulatory to the lobby at this time.  
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
## 2018-06-11 ENCOUNTER — Other Ambulatory Visit: Payer: Self-pay | Admitting: Internal Medicine

## 2018-06-11 ENCOUNTER — Other Ambulatory Visit (HOSPITAL_COMMUNITY): Payer: Self-pay | Admitting: Internal Medicine

## 2018-06-11 DIAGNOSIS — M899 Disorder of bone, unspecified: Secondary | ICD-10-CM

## 2018-06-13 ENCOUNTER — Ambulatory Visit
Admission: RE | Admit: 2018-06-13 | Discharge: 2018-06-13 | Disposition: A | Discharge status: Home / Self Care | Payer: Medicare HMO | Source: Ambulatory Visit | Attending: Internal Medicine | Admitting: Internal Medicine

## 2018-06-13 DIAGNOSIS — M899 Disorder of bone, unspecified: Secondary | ICD-10-CM | POA: Diagnosis present

## 2018-12-04 ENCOUNTER — Emergency Department
Admission: EM | Admit: 2018-12-04 | Discharge: 2018-12-05 | Disposition: A | Discharge status: Home / Self Care | Payer: Medicare HMO | Attending: Emergency Medicine | Admitting: Emergency Medicine

## 2018-12-04 ENCOUNTER — Other Ambulatory Visit: Payer: Self-pay

## 2018-12-04 ENCOUNTER — Emergency Department: Payer: Medicare HMO

## 2018-12-04 ENCOUNTER — Encounter: Payer: Self-pay | Admitting: Emergency Medicine

## 2018-12-04 DIAGNOSIS — Y998 Other external cause status: Secondary | ICD-10-CM | POA: Insufficient documentation

## 2018-12-04 DIAGNOSIS — Z23 Encounter for immunization: Secondary | ICD-10-CM | POA: Insufficient documentation

## 2018-12-04 DIAGNOSIS — Z7902 Long term (current) use of antithrombotics/antiplatelets: Secondary | ICD-10-CM | POA: Diagnosis not present

## 2018-12-04 DIAGNOSIS — S60811A Abrasion of right wrist, initial encounter: Secondary | ICD-10-CM | POA: Insufficient documentation

## 2018-12-04 DIAGNOSIS — I10 Essential (primary) hypertension: Secondary | ICD-10-CM | POA: Diagnosis not present

## 2018-12-04 DIAGNOSIS — S0003XA Contusion of scalp, initial encounter: Secondary | ICD-10-CM | POA: Diagnosis not present

## 2018-12-04 DIAGNOSIS — Y92019 Unspecified place in single-family (private) house as the place of occurrence of the external cause: Secondary | ICD-10-CM | POA: Diagnosis not present

## 2018-12-04 DIAGNOSIS — T07XXXA Unspecified multiple injuries, initial encounter: Secondary | ICD-10-CM

## 2018-12-04 DIAGNOSIS — S80211A Abrasion, right knee, initial encounter: Secondary | ICD-10-CM | POA: Diagnosis not present

## 2018-12-04 DIAGNOSIS — Z79899 Other long term (current) drug therapy: Secondary | ICD-10-CM | POA: Diagnosis not present

## 2018-12-04 DIAGNOSIS — S80812A Abrasion, left lower leg, initial encounter: Secondary | ICD-10-CM | POA: Insufficient documentation

## 2018-12-04 DIAGNOSIS — Y9389 Activity, other specified: Secondary | ICD-10-CM | POA: Diagnosis not present

## 2018-12-04 DIAGNOSIS — S80212A Abrasion, left knee, initial encounter: Secondary | ICD-10-CM | POA: Diagnosis not present

## 2018-12-04 DIAGNOSIS — S60812A Abrasion of left wrist, initial encounter: Secondary | ICD-10-CM | POA: Diagnosis not present

## 2018-12-04 DIAGNOSIS — W108XXA Fall (on) (from) other stairs and steps, initial encounter: Secondary | ICD-10-CM | POA: Insufficient documentation

## 2018-12-04 DIAGNOSIS — S0990XA Unspecified injury of head, initial encounter: Secondary | ICD-10-CM | POA: Diagnosis present

## 2018-12-04 LAB — CBC WITH DIFFERENTIAL/PLATELET
Abs Immature Granulocytes: 0.02 10*3/uL (ref 0.00–0.07)
Basophils Absolute: 0.1 10*3/uL (ref 0.0–0.1)
Basophils Relative: 1 %
Eosinophils Absolute: 0.2 10*3/uL (ref 0.0–0.5)
Eosinophils Relative: 2 %
HCT: 43.6 % (ref 39.0–52.0)
Hemoglobin: 15.5 g/dL (ref 13.0–17.0)
Immature Granulocytes: 0 %
Lymphocytes Relative: 38 %
Lymphs Abs: 3.2 10*3/uL (ref 0.7–4.0)
MCH: 30.6 pg (ref 26.0–34.0)
MCHC: 35.6 g/dL (ref 30.0–36.0)
MCV: 86.2 fL (ref 80.0–100.0)
Monocytes Absolute: 1 10*3/uL (ref 0.1–1.0)
Monocytes Relative: 12 %
Neutro Abs: 4 10*3/uL (ref 1.7–7.7)
Neutrophils Relative %: 47 %
Platelets: 228 10*3/uL (ref 150–400)
RBC: 5.06 MIL/uL (ref 4.22–5.81)
RDW: 12.2 % (ref 11.5–15.5)
WBC: 8.5 10*3/uL (ref 4.0–10.5)
nRBC: 0 % (ref 0.0–0.2)

## 2018-12-04 LAB — COMPREHENSIVE METABOLIC PANEL
ALT: 39 U/L (ref 0–44)
AST: 33 U/L (ref 15–41)
Albumin: 4.2 g/dL (ref 3.5–5.0)
Alkaline Phosphatase: 67 U/L (ref 38–126)
Anion gap: 12 (ref 5–15)
BUN: 13 mg/dL (ref 8–23)
CO2: 22 mmol/L (ref 22–32)
Calcium: 9.1 mg/dL (ref 8.9–10.3)
Chloride: 98 mmol/L (ref 98–111)
Creatinine, Ser: 0.8 mg/dL (ref 0.61–1.24)
GFR calc Af Amer: >60 mL/min (ref >60)
GFR calc non Af Amer: >60 mL/min (ref >60)
Glucose, Bld: 149 mg/dL — ABNORMAL HIGH (ref 70–99)
Potassium: 3.7 mmol/L (ref 3.5–5.1)
Sodium: 132 mmol/L — ABNORMAL LOW (ref 135–145)
Total Bilirubin: 0.6 mg/dL (ref 0.3–1.2)
Total Protein: 7.5 g/dL (ref 6.5–8.1)

## 2018-12-04 LAB — TROPONIN I (HIGH SENSITIVITY): Troponin I (High Sensitivity): 7 ng/L (ref <18)

## 2018-12-04 MED ORDER — ACETAMINOPHEN 500 MG PO TABS
1000.0000 mg | ORAL_TABLET | ORAL | Status: AC
  Administered 2018-12-04: 1000 mg via ORAL
  Filled 2018-12-04: qty 2

## 2018-12-04 MED ORDER — TETANUS-DIPHTH-ACELL PERTUSSIS 5-2.5-18.5 LF-MCG/0.5 IM SUSP
0.5000 mL | Freq: Once | INTRAMUSCULAR | Status: AC
  Administered 2018-12-04: 0.5 mL via INTRAMUSCULAR
  Filled 2018-12-04: qty 0.5

## 2018-12-04 NOTE — ED Notes (Signed)
Spoke with Dr Jacqualine Code and orders obtained for CT imaging,no xray imaging at this time

## 2018-12-04 NOTE — ED Notes (Signed)
Patient ambulated to restroom with cane, standby assist. NAD tolerated well
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
## 2018-12-05 NOTE — ED Notes (Signed)
Patient discharged, instructions reviewed and patient and wife expressed understanding. Patient spoke with MD who said it was OK for patient to drive himself home, made sure with patient that he was feeling OK to drive. Offered to wheel patient to car, patient refused, walked out with cane.

## 2019-05-31 ENCOUNTER — Ambulatory Visit: Payer: Medicare HMO

## 2019-12-07 ENCOUNTER — Emergency Department: Payer: Medicare HMO

## 2019-12-07 ENCOUNTER — Emergency Department
Admission: EM | Admit: 2019-12-07 | Discharge: 2019-12-07 | Disposition: A | Discharge status: Home / Self Care | Payer: Medicare HMO | Attending: Emergency Medicine | Admitting: Emergency Medicine

## 2019-12-07 ENCOUNTER — Other Ambulatory Visit: Payer: Self-pay

## 2019-12-07 ENCOUNTER — Encounter: Payer: Self-pay | Admitting: Emergency Medicine

## 2019-12-07 DIAGNOSIS — I10 Essential (primary) hypertension: Secondary | ICD-10-CM | POA: Diagnosis not present

## 2019-12-07 DIAGNOSIS — R42 Dizziness and giddiness: Secondary | ICD-10-CM | POA: Diagnosis not present

## 2019-12-07 DIAGNOSIS — R5382 Chronic fatigue, unspecified: Secondary | ICD-10-CM

## 2019-12-07 DIAGNOSIS — Z79899 Other long term (current) drug therapy: Secondary | ICD-10-CM | POA: Insufficient documentation

## 2019-12-07 DIAGNOSIS — R5383 Other fatigue: Secondary | ICD-10-CM | POA: Diagnosis present

## 2019-12-07 LAB — COMPREHENSIVE METABOLIC PANEL
ALT: 45 U/L — ABNORMAL HIGH (ref 0–44)
AST: 32 U/L (ref 15–41)
Albumin: 3.8 g/dL (ref 3.5–5.0)
Alkaline Phosphatase: 60 U/L (ref 38–126)
Anion gap: 11 (ref 5–15)
BUN: 13 mg/dL (ref 8–23)
CO2: 22 mmol/L (ref 22–32)
Calcium: 8.8 mg/dL — ABNORMAL LOW (ref 8.9–10.3)
Chloride: 95 mmol/L — ABNORMAL LOW (ref 98–111)
Creatinine, Ser: 0.65 mg/dL (ref 0.61–1.24)
GFR calc Af Amer: >60 mL/min (ref >60)
GFR calc non Af Amer: >60 mL/min (ref >60)
Glucose, Bld: 131 mg/dL — ABNORMAL HIGH (ref 70–99)
Potassium: 3.9 mmol/L (ref 3.5–5.1)
Sodium: 128 mmol/L — ABNORMAL LOW (ref 135–145)
Total Bilirubin: 0.8 mg/dL (ref 0.3–1.2)
Total Protein: 6.8 g/dL (ref 6.5–8.1)

## 2019-12-07 LAB — URINALYSIS, COMPLETE (UACMP) WITH MICROSCOPIC
Bacteria, UA: NONE SEEN
Bilirubin Urine: NEGATIVE
Glucose, UA: NEGATIVE mg/dL
Hgb urine dipstick: NEGATIVE
Ketones, ur: NEGATIVE mg/dL
Leukocytes,Ua: NEGATIVE
Nitrite: NEGATIVE
Protein, ur: NEGATIVE mg/dL
Specific Gravity, Urine: 1.013 (ref 1.005–1.030)
Squamous Epithelial / LPF: NONE SEEN (ref 0–5)
pH: 7 (ref 5.0–8.0)

## 2019-12-07 LAB — CBC
HCT: 43.1 % (ref 39.0–52.0)
Hemoglobin: 14.9 g/dL (ref 13.0–17.0)
MCH: 30.3 pg (ref 26.0–34.0)
MCHC: 34.6 g/dL (ref 30.0–36.0)
MCV: 87.8 fL (ref 80.0–100.0)
Platelets: 204 10*3/uL (ref 150–400)
RBC: 4.91 MIL/uL (ref 4.22–5.81)
RDW: 12.5 % (ref 11.5–15.5)
WBC: 9 10*3/uL (ref 4.0–10.5)
nRBC: 0 % (ref 0.0–0.2)

## 2019-12-07 MED ORDER — SODIUM CHLORIDE 0.9% FLUSH: 3.0000 mL | Freq: Once | INTRAVENOUS | Status: DC
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
## 2019-12-07 NOTE — Discharge Instructions (Signed)
Please call Dr. Juanell Fairly office tomorrow to set up a follow-up with him.  I would specifically address your morning fatigue and consider potential that some of your morning medications might be a potential cause for it.  I would review this carefully with your primary doctor to see if changes could be made.

## 2019-12-07 NOTE — ED Triage Notes (Signed)
Pt presents to ED via POV with c/o that have been ongoing x several months. Pt states symptoms the same every day. Pt states wakes up, notes that he is dizzy, then feels like he could go back to sleep, then dozes for a while, then wakes up later in the day and feels fine. Pt states "I'm 12, if I was 43 or 41 I'd understanding that it was my age". Pt states the dizziness has resolved upon arrival to ED.

## 2020-02-06 ENCOUNTER — Ambulatory Visit: Admit: 2020-02-06 | Payer: Medicare HMO

## 2020-02-06 SURGERY — COLONOSCOPY WITH PROPOFOL
Anesthesia: General

## 2020-03-01 ENCOUNTER — Ambulatory Visit: Payer: Medicare HMO

## 2020-03-02 ENCOUNTER — Other Ambulatory Visit
Admission: RE | Admit: 2020-03-02 | Discharge: 2020-03-02 | Disposition: A | Discharge status: Home / Self Care | Payer: Medicare HMO | Source: Ambulatory Visit | Attending: Gastroenterology | Admitting: Gastroenterology

## 2020-03-02 ENCOUNTER — Other Ambulatory Visit: Payer: Self-pay

## 2020-03-02 DIAGNOSIS — Z20822 Contact with and (suspected) exposure to covid-19: Secondary | ICD-10-CM | POA: Diagnosis not present

## 2020-03-02 DIAGNOSIS — Z01812 Encounter for preprocedural laboratory examination: Secondary | ICD-10-CM | POA: Insufficient documentation

## 2020-03-02 LAB — SARS CORONAVIRUS 2 (TAT 6-24 HRS): SARS Coronavirus 2: NEGATIVE

## 2020-03-03 ENCOUNTER — Other Ambulatory Visit: Payer: Medicare HMO

## 2020-03-04 ENCOUNTER — Encounter: Payer: Self-pay | Admitting: Internal Medicine

## 2020-03-05 ENCOUNTER — Ambulatory Visit: Payer: Medicare HMO | Admitting: Anesthesiology

## 2020-03-05 ENCOUNTER — Other Ambulatory Visit: Payer: Self-pay

## 2020-03-05 ENCOUNTER — Encounter
Admission: RE | Disposition: A | Discharge status: Home / Self Care | Payer: Self-pay | Source: Home / Self Care | Attending: Gastroenterology

## 2020-03-05 ENCOUNTER — Encounter: Payer: Self-pay | Admitting: Internal Medicine

## 2020-03-05 ENCOUNTER — Ambulatory Visit
Admission: RE | Admit: 2020-03-05 | Discharge: 2020-03-05 | Disposition: A | Discharge status: Home / Self Care | Payer: Medicare HMO | Attending: Gastroenterology | Admitting: Gastroenterology

## 2020-03-05 DIAGNOSIS — K317 Polyp of stomach and duodenum: Secondary | ICD-10-CM | POA: Diagnosis not present

## 2020-03-05 DIAGNOSIS — Z7952 Long term (current) use of systemic steroids: Secondary | ICD-10-CM | POA: Insufficient documentation

## 2020-03-05 DIAGNOSIS — I509 Heart failure, unspecified: Secondary | ICD-10-CM | POA: Insufficient documentation

## 2020-03-05 DIAGNOSIS — K640 First degree hemorrhoids: Secondary | ICD-10-CM | POA: Insufficient documentation

## 2020-03-05 DIAGNOSIS — K2270 Barrett's esophagus without dysplasia: Secondary | ICD-10-CM | POA: Insufficient documentation

## 2020-03-05 DIAGNOSIS — K219 Gastro-esophageal reflux disease without esophagitis: Secondary | ICD-10-CM | POA: Diagnosis not present

## 2020-03-05 DIAGNOSIS — I110 Hypertensive heart disease with heart failure: Secondary | ICD-10-CM | POA: Insufficient documentation

## 2020-03-05 DIAGNOSIS — Z79899 Other long term (current) drug therapy: Secondary | ICD-10-CM | POA: Insufficient documentation

## 2020-03-05 DIAGNOSIS — Z86010 Personal history of colonic polyps: Secondary | ICD-10-CM | POA: Diagnosis not present

## 2020-03-05 DIAGNOSIS — Z1211 Encounter for screening for malignant neoplasm of colon: Secondary | ICD-10-CM | POA: Diagnosis present

## 2020-03-05 DIAGNOSIS — Z7902 Long term (current) use of antithrombotics/antiplatelets: Secondary | ICD-10-CM | POA: Insufficient documentation

## 2020-03-05 DIAGNOSIS — I2510 Atherosclerotic heart disease of native coronary artery without angina pectoris: Secondary | ICD-10-CM | POA: Diagnosis not present

## 2020-03-05 DIAGNOSIS — E785 Hyperlipidemia, unspecified: Secondary | ICD-10-CM | POA: Insufficient documentation

## 2020-03-05 DIAGNOSIS — Z886 Allergy status to analgesic agent status: Secondary | ICD-10-CM | POA: Insufficient documentation

## 2020-03-05 HISTORY — DX: Gastro-esophageal reflux disease without esophagitis: K21.9

## 2020-03-05 HISTORY — PX: ESOPHAGOGASTRODUODENOSCOPY (EGD) WITH PROPOFOL: SHX5813

## 2020-03-05 HISTORY — DX: Gout, unspecified: M10.9

## 2020-03-05 HISTORY — DX: Atherosclerotic heart disease of native coronary artery without angina pectoris: I25.10

## 2020-03-05 HISTORY — PX: COLONOSCOPY WITH PROPOFOL: SHX5780

## 2020-03-05 HISTORY — DX: Occlusion and stenosis of bilateral carotid arteries: I65.23

## 2020-03-05 HISTORY — DX: Unspecified osteoarthritis, unspecified site: M19.90

## 2020-03-05 HISTORY — DX: Unspecified glaucoma: H40.9

## 2020-03-05 HISTORY — DX: Heart failure, unspecified: I50.9

## 2020-03-05 SURGERY — ESOPHAGOGASTRODUODENOSCOPY (EGD) WITH PROPOFOL
Anesthesia: General

## 2020-03-05 MED ORDER — SODIUM CHLORIDE 0.9 % IV SOLN: INTRAVENOUS | Status: DC

## 2020-03-05 MED ORDER — LIDOCAINE HCL (CARDIAC) PF 100 MG/5ML IV SOSY
PREFILLED_SYRINGE | INTRAVENOUS | Status: DC | PRN
  Administered 2020-03-05: 50 mg via INTRAVENOUS

## 2020-03-05 MED ORDER — PROPOFOL 500 MG/50ML IV EMUL
INTRAVENOUS | Status: DC | PRN
  Administered 2020-03-05: 120 ug/kg/min via INTRAVENOUS

## 2020-03-05 MED ORDER — LIDOCAINE HCL (PF) 2 % IJ SOLN
INTRAMUSCULAR | Status: AC
  Filled 2020-03-05: qty 5

## 2020-03-05 MED ORDER — PROPOFOL 500 MG/50ML IV EMUL
INTRAVENOUS | Status: AC
  Filled 2020-03-05: qty 50
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
## 2020-03-05 NOTE — Op Note (Signed)
[facility] Gastroenterology Patient Name: Shawn Hart Procedure Date: 03/05/2020 9:36 AM MRN: [id number] Account #: [id number] Date of Birth: 1940-10-29 Admit Type: Outpatient Age: 79 Room: [facility] ENDO ROOM 3 Gender: Male Note Status: Finalized Procedure:             Upper GI endoscopy Indications:           Barrett's esophagus Providers:             Andrey Farmer MD, MD Referring MD:          Leona Carry. Hall Busing, MD (Referring MD) Medicines:             Monitored Anesthesia Care Complications:         No immediate complications. Estimated blood loss:                         Minimal. Procedure:             Pre-Anesthesia Assessment:                        - Prior to the procedure, a History and Physical was                         performed, and patient medications and allergies were                         reviewed. The patient is competent. The risks and                         benefits of the procedure and the sedation options and                         risks were discussed with the patient. All questions                         were answered and informed consent was obtained.                         Patient identification and proposed procedure were                         verified by the physician, the nurse, the anesthetist                         and the technician in the endoscopy suite. Mental                         Status Examination: alert and oriented. Airway                         Examination: normal oropharyngeal airway and neck                         mobility. Respiratory Examination: clear to                         auscultation. CV Examination: normal. Prophylactic  Antibiotics: The patient does not require prophylactic                         antibiotics. Prior Anticoagulants: The patient has                         taken no previous anticoagulant or antiplatelet                         agents. ASA Grade Assessment: III  - A patient with                         severe systemic disease. After reviewing the risks and                         benefits, the patient was deemed in satisfactory                         condition to undergo the procedure. The anesthesia                         plan was to use monitored anesthesia care (MAC).                         Immediately prior to administration of medications,                         the patient was re-assessed for adequacy to receive                         sedatives. The heart rate, respiratory rate, oxygen                         saturations, blood pressure, adequacy of pulmonary                         ventilation, and response to care were monitored                         throughout the procedure. The physical status of the                         patient was re-assessed after the procedure.                        After obtaining informed consent, the endoscope was                         passed under direct vision. Throughout the procedure,                         the patient's blood pressure, pulse, and oxygen                         saturations were monitored continuously. The Endoscope                         was introduced through the mouth, and advanced to the  second part of duodenum. The upper GI endoscopy was                         accomplished without difficulty. The patient tolerated                         the procedure well. Findings:      The esophagus and gastroesophageal junction were examined with white       light and narrow band imaging (NBI). There were esophageal mucosal       changes consistent with short-segment Barrett's esophagus. These changes       involved the mucosa extending to the Z-line. One tongue of       salmon-colored mucosa was present. Mucosa was biopsied with a cold       forceps for histology in a targeted manner in the lower third of the       esophagus. One specimen bottle was sent to  pathology. Estimated blood       loss was minimal.      A few 1 to 7 mm sessile polyps with no stigmata of recent bleeding were       found in the gastric body. The polyp was removed with a cold snare.       Resection and retrieval were complete. Estimated blood loss was minimal.      The exam of the stomach was otherwise normal.      The examined duodenum was normal. Impression:            - Esophageal mucosal changes consistent with                         short-segment Barrett's esophagus. Biopsied.                        - A few gastric polyps. Resected and retrieved.                        - Normal examined duodenum. Recommendation:        - Discharge patient to home.                        - Resume previous diet.                        - Continue present medications.                        - Await pathology results.                        - Repeat upper endoscopy for surveillance based on                         pathology results.                        - Return to referring physician as previously                         scheduled. Procedure Code(s):     --- Professional ---  43251, Esophagogastroduodenoscopy, flexible,                         transoral; with removal of tumor(s), polyp(s), or                         other lesion(s) by snare technique Diagnosis Code(s):     --- Professional ---                        K22.70, Barrett's esophagus without dysplasia                        K31.7, Polyp of stomach and duodenum CPT copyright 2019 American Medical Association. All rights reserved. The codes documented in this report are preliminary and upon coder review may  be revised to meet current compliance requirements. Andrey Farmer, MD Andrey Farmer MD, MD 03/05/2020 10:22:25 AM Number of Addenda: 0 Note Initiated On: 03/05/2020 9:36 AM Scope Withdrawal Time: 0 hours 6 minutes 11 seconds  Total Procedure Duration: 0 hours 17 minutes 30 seconds   Estimated Blood Loss:  Estimated blood loss was minimal.      [facility]

## 2020-03-05 NOTE — Interval H&P Note (Signed)
History and Physical Interval Note:  03/05/2020 9:35 AM  Shawn Hart  has presented today for surgery, with the diagnosis of BARRETT'S ESOPHAGUS,PERSONAL HX.OF COLON POLYPS.  The various methods of treatment have been discussed with the patient and family. After consideration of risks, benefits and other options for treatment, the patient has consented to  Procedure(s): ESOPHAGOGASTRODUODENOSCOPY (EGD) WITH PROPOFOL (N/A) COLONOSCOPY WITH PROPOFOL (N/A) as a surgical intervention.  The patient's history has been reviewed, patient examined, no change in status, stable for surgery.  I have reviewed the patient's chart and labs.  Questions were answered to the patient's satisfaction.     Lesly Rubenstein  Ok to proceed with EGD/Colonoscopy

## 2020-03-05 NOTE — Transfer of Care (Signed)
Immediate Anesthesia Transfer of Care Note  Patient: Shawn Hart  Procedure(s) Performed: ESOPHAGOGASTRODUODENOSCOPY (EGD) WITH PROPOFOL (N/A ) COLONOSCOPY WITH PROPOFOL (N/A )  Patient Location: PACU  Anesthesia Type:General  Level of Consciousness: awake and sedated  Airway & Oxygen Therapy: Patient Spontanous Breathing and Patient connected to nasal cannula oxygen  Post-op Assessment: Report given to RN and Post -op Vital signs reviewed and stable  Post vital signs: Reviewed and stable  Last Vitals:  Vitals Value Taken Time  BP    Temp    Pulse    Resp    SpO2      Last Pain:  Vitals:   03/05/20 0934  TempSrc: Temporal  PainSc: 0-No pain         Complications: No complications documented.

## 2020-03-05 NOTE — Anesthesia Postprocedure Evaluation (Signed)
Anesthesia Post Note  Patient: Shawn Hart  Procedure(s) Performed: ESOPHAGOGASTRODUODENOSCOPY (EGD) WITH PROPOFOL (N/A ) COLONOSCOPY WITH PROPOFOL (N/A )  Patient location during evaluation: Endoscopy Anesthesia Type: General Level of consciousness: awake and alert and oriented Pain management: pain level controlled Vital Signs Assessment: post-procedure vital signs reviewed and stable Respiratory status: spontaneous breathing, nonlabored ventilation and respiratory function stable Cardiovascular status: blood pressure returned to baseline and stable Postop Assessment: no signs of nausea or vomiting Anesthetic complications: no   No complications documented.   Last Vitals:  Vitals:   03/05/20 1030 03/05/20 1040  BP: (!) 88/73 103/69  Pulse: 68 73  Resp: 19 18  Temp:    SpO2: 99% 98%    Last Pain:  Vitals:   03/05/20 1010  TempSrc: Temporal  PainSc:                  [name]

## 2020-03-05 NOTE — Anesthesia Procedure Notes (Signed)
Performed by: Cook-Martin, Gertie Broerman Pre-anesthesia Checklist: Patient identified, Emergency Drugs available, Suction available, Patient being monitored and Timeout performed Patient Re-evaluated:Patient Re-evaluated prior to induction Oxygen Delivery Method: Nasal cannula Preoxygenation: Pre-oxygenation with 100% oxygen Induction Type: IV induction Airway Equipment and Method: Bite block Placement Confirmation: CO2 detector and positive ETCO2       

## 2020-03-05 NOTE — Op Note (Signed)
[facility] Gastroenterology Patient Name: Shawn Hart Procedure Date: 03/05/2020 9:35 AM MRN: [id number] Account #: [id number] Date of Birth: [birth year] Admit Type: Outpatient Age: 79 Room: [facility] ENDO ROOM 3 Gender: Male Note Status: Finalized Procedure:             Colonoscopy Indications:           High risk colon cancer surveillance: Personal history                         of colonic polyps Providers:             Andrey Farmer MD, MD Referring MD:          Leona Carry. Hall Busing, MD (Referring MD) Medicines:             Monitored Anesthesia Care Complications:         No immediate complications. Procedure:             Pre-Anesthesia Assessment:                        - Prior to the procedure, a History and Physical was                         performed, and patient medications and allergies were                         reviewed. The patient is competent. The risks and                         benefits of the procedure and the sedation options and                         risks were discussed with the patient. All questions                         were answered and informed consent was obtained.                         Patient identification and proposed procedure were                         verified by the physician, the nurse, the anesthetist                         and the technician in the endoscopy suite. Mental                         Status Examination: alert and oriented. Airway                         Examination: normal oropharyngeal airway and neck                         mobility. Respiratory Examination: clear to                         auscultation. CV Examination: normal. Prophylactic  Antibiotics: The patient does not require prophylactic                         antibiotics. Prior Anticoagulants: The patient has                         taken Plavix (clopidogrel), last dose was 5 days prior                         to procedure.  ASA Grade Assessment: III - A patient                         with severe systemic disease. After reviewing the                         risks and benefits, the patient was deemed in                         satisfactory condition to undergo the procedure. The                         anesthesia plan was to use monitored anesthesia care                         (MAC). Immediately prior to administration of                         medications, the patient was re-assessed for adequacy                         to receive sedatives. The heart rate, respiratory                         rate, oxygen saturations, blood pressure, adequacy of                         pulmonary ventilation, and response to care were                         monitored throughout the procedure. The physical                         status of the patient was re-assessed after the                         procedure.                        After obtaining informed consent, the colonoscope was                         passed under direct vision. Throughout the procedure,                         the patient's blood pressure, pulse, and oxygen                         saturations were monitored continuously. The  Colonoscope was introduced through the anus and                         advanced to the the cecum, identified by appendiceal                         orifice and ileocecal valve. The colonoscopy was                         technically difficult and complex due to a redundant                         colon and significant looping. Successful completion                         of the procedure was aided by applying abdominal                         pressure. The patient tolerated the procedure well.                         The quality of the bowel preparation was good. Findings:      The perianal and digital rectal examinations were normal.      Internal hemorrhoids were found during retroflexion. The  hemorrhoids       were Grade I (internal hemorrhoids that do not prolapse).      The exam was otherwise without abnormality on direct and retroflexion       views. Impression:            - Internal hemorrhoids.                        - The examination was otherwise normal on direct and                         retroflexion views.                        - No specimens collected. Recommendation:        - Discharge patient to home.                        - Resume previous diet.                        - Continue present medications.                        - Resume Plavix (clopidogrel) at prior dose today.                        - Repeat colonoscopy is not recommended due to current                         age (76 years or older) for surveillance.                        - Return to referring physician as previously  scheduled. Procedure Code(s):     --- Professional ---                        W4097, Colorectal cancer screening; colonoscopy on                         individual at high risk Diagnosis Code(s):     --- Professional ---                        Z86.010, Personal history of colonic polyps                        K64.0, First degree hemorrhoids CPT copyright 2019 American Medical Association. All rights reserved. The codes documented in this report are preliminary and upon coder review may  be revised to meet current compliance requirements. Andrey Farmer, MD Andrey Farmer MD, MD 03/05/2020 10:26:35 AM Number of Addenda: 0 Note Initiated On: 03/05/2020 9:35 AM Estimated Blood Loss:  Estimated blood loss: none.      [facility]

## 2020-03-08 ENCOUNTER — Encounter: Payer: Self-pay | Admitting: Gastroenterology

## 2020-03-08 ENCOUNTER — Ambulatory Visit: Payer: Medicare HMO | Attending: Internal Medicine

## 2020-03-08 DIAGNOSIS — Z23 Encounter for immunization: Secondary | ICD-10-CM

## 2020-03-08 LAB — SURGICAL PATHOLOGY

## 2020-03-08 NOTE — Progress Notes (Signed)
   Covid-19 Vaccination Clinic  Name:  Shawn Hart    MRN: [id number] DOB: 04-18-1941  03/08/2020  Mr. Ohagan was observed post Covid-19 immunization for 30 minutes based on pre-vaccination screening without incident. He was provided with Vaccine Information Sheet and instruction to access the V-Safe system.   Mr. Gilberg was instructed to call 911 with any severe reactions post vaccine: Marland Kitchen Difficulty breathing  . Swelling of face and throat  . A fast heartbeat  . A bad rash all over body  . Dizziness and weakness

## 2020-06-02 ENCOUNTER — Other Ambulatory Visit: Payer: Self-pay | Admitting: Neurology

## 2020-06-02 DIAGNOSIS — G603 Idiopathic progressive neuropathy: Secondary | ICD-10-CM

## 2020-06-11 ENCOUNTER — Other Ambulatory Visit: Payer: Self-pay

## 2020-06-11 ENCOUNTER — Ambulatory Visit
Admission: RE | Admit: 2020-06-11 | Discharge: 2020-06-11 | Disposition: A | Discharge status: Home / Self Care | Payer: Medicare HMO | Source: Ambulatory Visit | Attending: Neurology | Admitting: Neurology

## 2020-06-11 DIAGNOSIS — G603 Idiopathic progressive neuropathy: Secondary | ICD-10-CM | POA: Diagnosis present

## 2020-12-23 IMAGING — CT CT HEAD W/O CM
3 series · 16 of 47 positions shown, 19 images · non-contrast
Comparison: Head CT 12/04/2018

CLINICAL DATA: Dizziness.  Several months of dizziness

EXAM:
CT HEAD WITHOUT CONTRAST
TECHNIQUE: Contiguous axial images were obtained from the base of the skull
through the vertex without intravenous contrast.

[Series 2: head wo · axial · 0.43mm/px · z∈[-186,-56]mm · 10 of 32 slices shown, 13 images]
[im 3/32  brain]
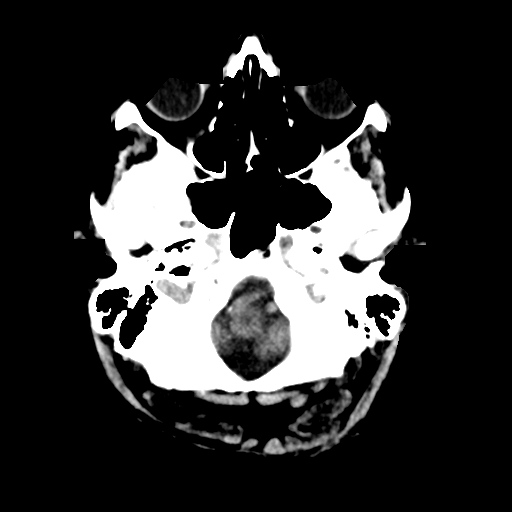
[im 3/32  bone]
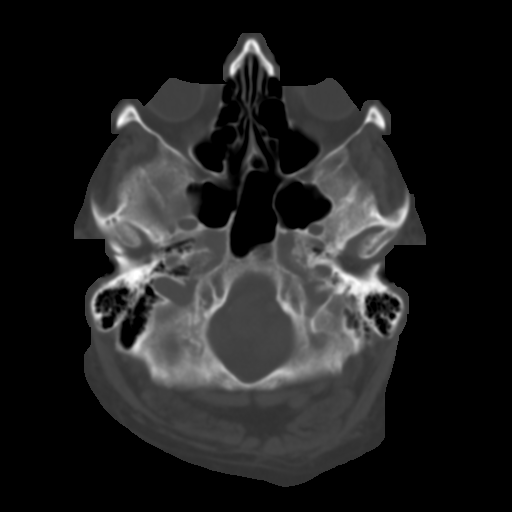
[im 6/32  brain]
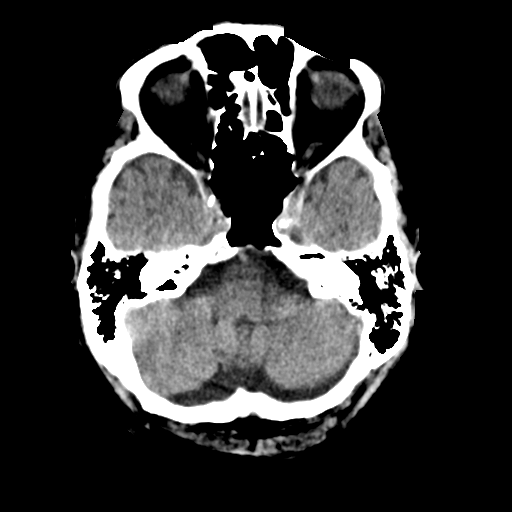
[im 9/32  brain]
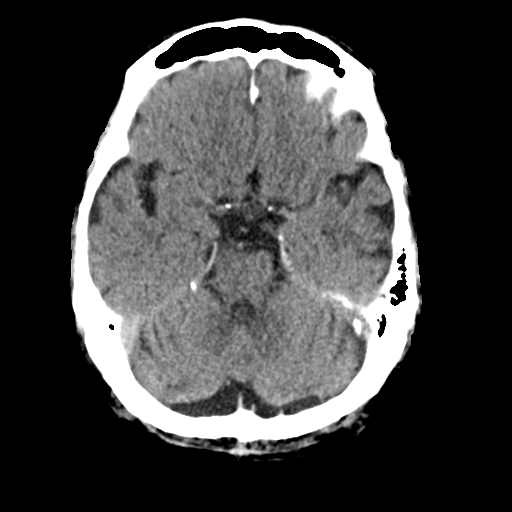
[im 11/32  brain]
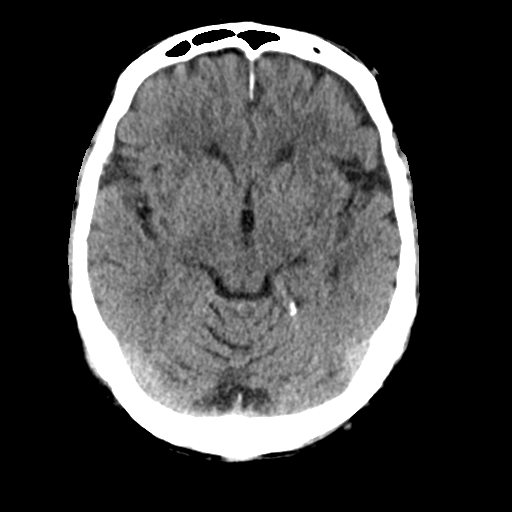
[im 14/32  brain]
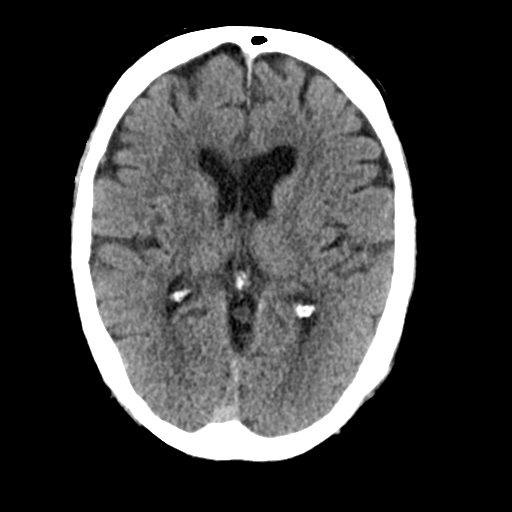
[im 14/32  bone]
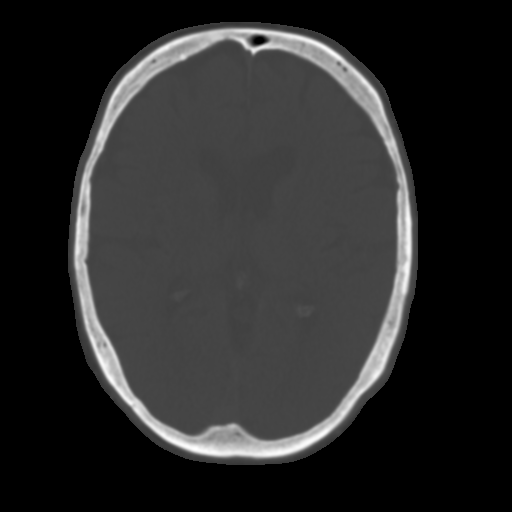
[im 18/32  brain]
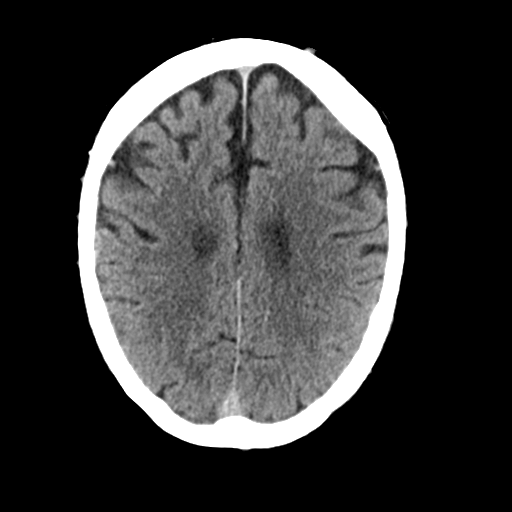
[im 21/32  brain]
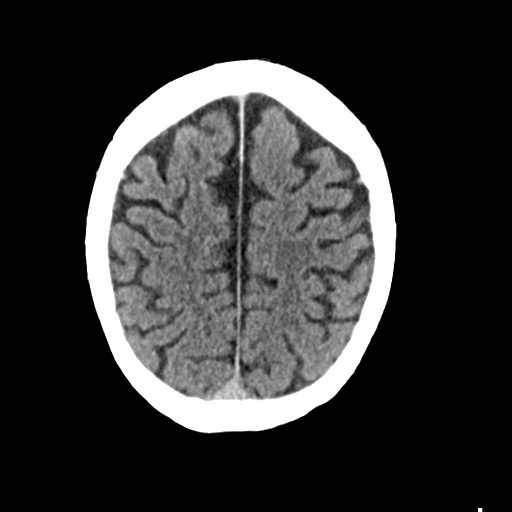
[im 24/32  brain]
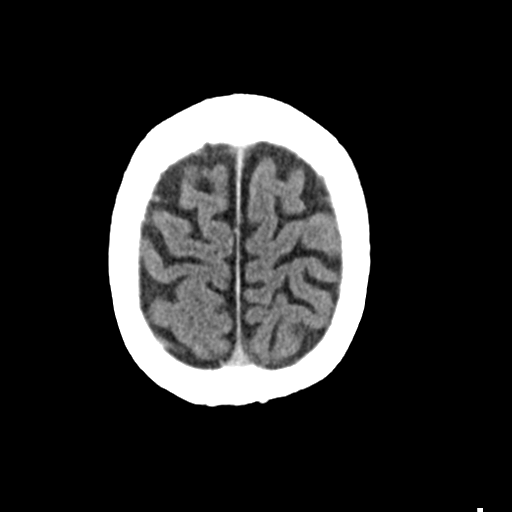
[im 26/32  brain]
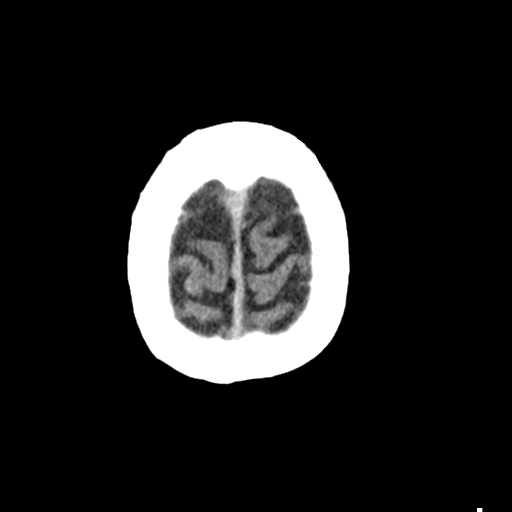
[im 26/32  bone]
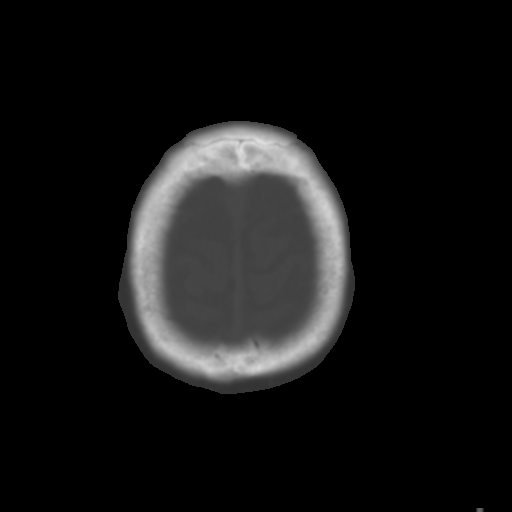
[im 29/32  brain]
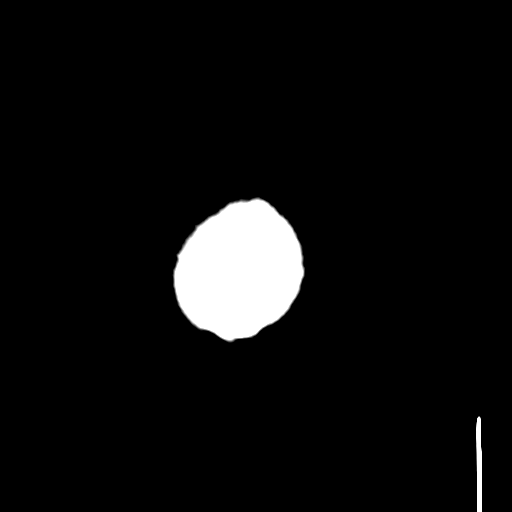

[Series 4: coronal soft tissue · coronal · 0.32mm/px · 3 of 69 slices shown]
[im 23/69  brain]
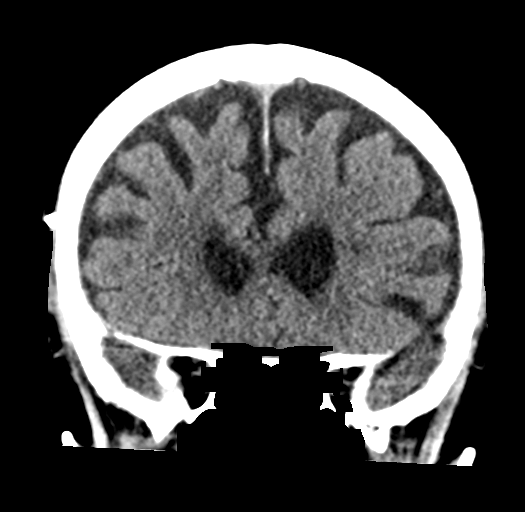
[im 31/69  brain]
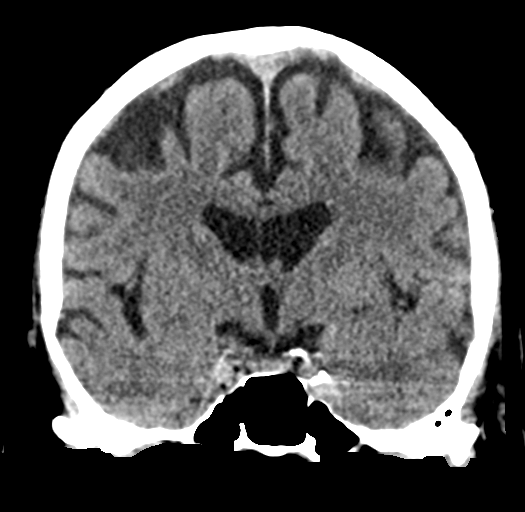
[im 38/69  brain]
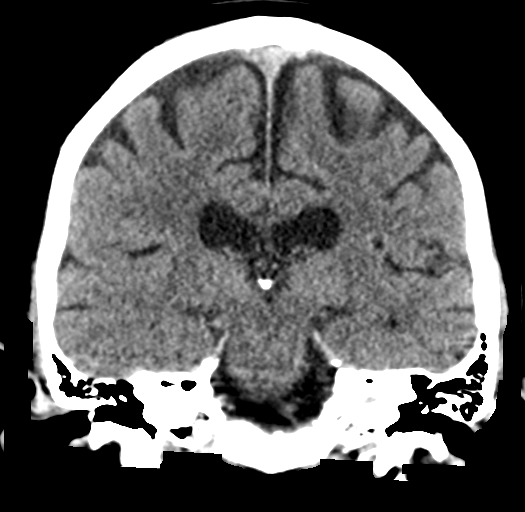

[Series 5: sagittal soft tissue · sagittal · 0.32mm/px · 3 of 56 slices shown]
[im 19/56  brain]
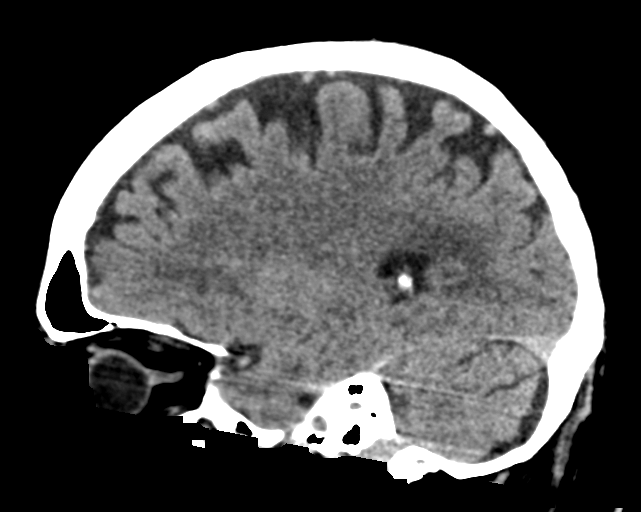
[im 28/56  brain]
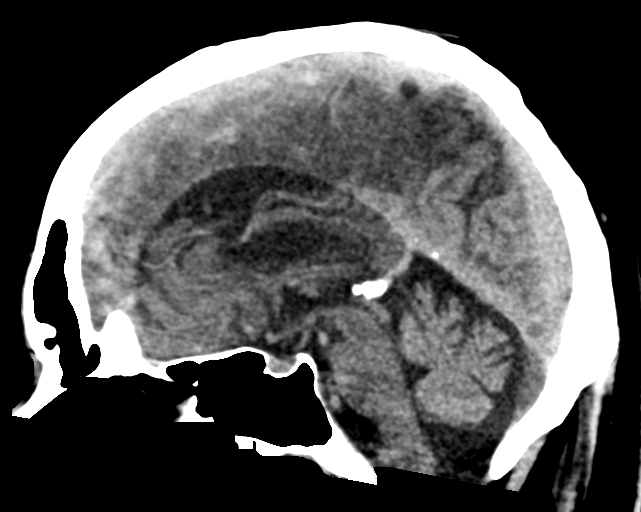
[im 37/56  brain]
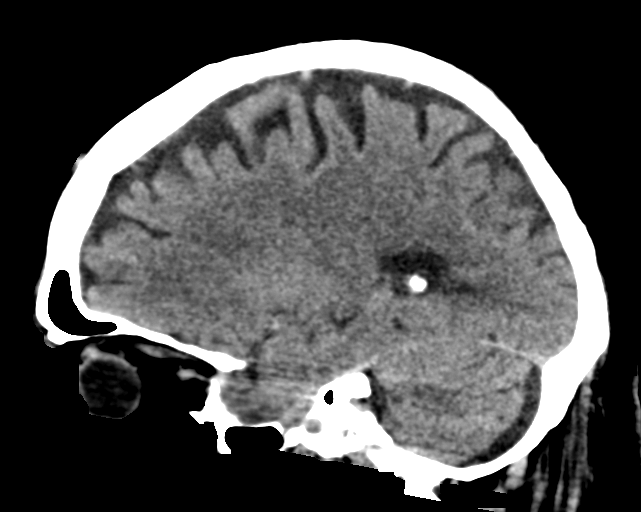

[16 of 47 positions shown; findings below may reference images not displayed]

FINDINGS: Brain: No acute intracranial hemorrhage. No focal mass lesion. No CT
evidence of acute infarction. No midline shift or mass effect. No
hydrocephalus. Basilar cisterns are patent.

Minimal periventricular and subcortical white matter hypodensities.
Minimal generalized cortical atrophy.

Vascular: No hyperdense vessel or unexpected calcification.

Skull: Normal. Negative for fracture or focal lesion.

Sinuses/Orbits: Paranasal sinuses and mastoid air cells are clear.
Orbits are clear.

Other: None.
IMPRESSION: No acute intracranial findings.

## 2022-01-22 ENCOUNTER — Encounter (HOSPITAL_COMMUNITY): Payer: Self-pay | Admitting: Emergency Medicine

## 2022-01-22 ENCOUNTER — Other Ambulatory Visit: Payer: Self-pay

## 2022-01-22 ENCOUNTER — Emergency Department (HOSPITAL_COMMUNITY)
Admission: EM | Admit: 2022-01-22 | Discharge: 2022-01-22 | Disposition: A | Discharge status: Home / Self Care | Payer: Medicare HMO | Attending: Emergency Medicine | Admitting: Emergency Medicine

## 2022-01-22 ENCOUNTER — Emergency Department (HOSPITAL_COMMUNITY): Payer: Medicare HMO

## 2022-01-22 DIAGNOSIS — E871 Hypo-osmolality and hyponatremia: Secondary | ICD-10-CM | POA: Insufficient documentation

## 2022-01-22 DIAGNOSIS — W19XXXA Unspecified fall, initial encounter: Secondary | ICD-10-CM

## 2022-01-22 DIAGNOSIS — S0990XA Unspecified injury of head, initial encounter: Secondary | ICD-10-CM | POA: Diagnosis present

## 2022-01-22 DIAGNOSIS — I2510 Atherosclerotic heart disease of native coronary artery without angina pectoris: Secondary | ICD-10-CM | POA: Diagnosis not present

## 2022-01-22 DIAGNOSIS — W01198A Fall on same level from slipping, tripping and stumbling with subsequent striking against other object, initial encounter: Secondary | ICD-10-CM | POA: Insufficient documentation

## 2022-01-22 DIAGNOSIS — K219 Gastro-esophageal reflux disease without esophagitis: Secondary | ICD-10-CM | POA: Insufficient documentation

## 2022-01-22 DIAGNOSIS — S0101XA Laceration without foreign body of scalp, initial encounter: Secondary | ICD-10-CM | POA: Diagnosis not present

## 2022-01-22 DIAGNOSIS — Z20822 Contact with and (suspected) exposure to covid-19: Secondary | ICD-10-CM | POA: Insufficient documentation

## 2022-01-22 DIAGNOSIS — Z7902 Long term (current) use of antithrombotics/antiplatelets: Secondary | ICD-10-CM | POA: Insufficient documentation

## 2022-01-22 DIAGNOSIS — M129 Arthropathy, unspecified: Secondary | ICD-10-CM | POA: Insufficient documentation

## 2022-01-22 DIAGNOSIS — Y92511 Restaurant or cafe as the place of occurrence of the external cause: Secondary | ICD-10-CM | POA: Insufficient documentation

## 2022-01-22 DIAGNOSIS — S63634A Sprain of interphalangeal joint of right ring finger, initial encounter: Secondary | ICD-10-CM | POA: Diagnosis not present

## 2022-01-22 DIAGNOSIS — Z79899 Other long term (current) drug therapy: Secondary | ICD-10-CM | POA: Insufficient documentation

## 2022-01-22 DIAGNOSIS — I10 Essential (primary) hypertension: Secondary | ICD-10-CM | POA: Diagnosis not present

## 2022-01-22 DIAGNOSIS — D126 Benign neoplasm of colon, unspecified: Secondary | ICD-10-CM | POA: Insufficient documentation

## 2022-01-22 LAB — COMPREHENSIVE METABOLIC PANEL
ALT: 30 U/L (ref 0–44)
AST: 30 U/L (ref 15–41)
Albumin: 3.7 g/dL (ref 3.5–5.0)
Alkaline Phosphatase: 61 U/L (ref 38–126)
Anion gap: 9 (ref 5–15)
BUN: 14 mg/dL (ref 8–23)
CO2: 25 mmol/L (ref 22–32)
Calcium: 9.3 mg/dL (ref 8.9–10.3)
Chloride: 97 mmol/L — ABNORMAL LOW (ref 98–111)
Creatinine, Ser: 0.86 mg/dL (ref 0.61–1.24)
GFR, Estimated: >60 mL/min (ref >60)
Glucose, Bld: 117 mg/dL — ABNORMAL HIGH (ref 70–99)
Potassium: 3.4 mmol/L — ABNORMAL LOW (ref 3.5–5.1)
Sodium: 131 mmol/L — ABNORMAL LOW (ref 135–145)
Total Bilirubin: 0.7 mg/dL (ref 0.3–1.2)
Total Protein: 6.9 g/dL (ref 6.5–8.1)

## 2022-01-22 LAB — RESP PANEL BY RT-PCR (FLU A&B, COVID) ARPGX2
Influenza A by PCR: NEGATIVE
Influenza B by PCR: NEGATIVE
SARS Coronavirus 2 by RT PCR: NEGATIVE

## 2022-01-22 LAB — CBC
HCT: 42.3 % (ref 39.0–52.0)
Hemoglobin: 15.1 g/dL (ref 13.0–17.0)
MCH: 31 pg (ref 26.0–34.0)
MCHC: 35.7 g/dL (ref 30.0–36.0)
MCV: 86.9 fL (ref 80.0–100.0)
Platelets: 218 10*3/uL (ref 150–400)
RBC: 4.87 MIL/uL (ref 4.22–5.81)
RDW: 12.2 % (ref 11.5–15.5)
WBC: 8.5 10*3/uL (ref 4.0–10.5)
nRBC: 0 % (ref 0.0–0.2)

## 2022-01-22 LAB — I-STAT CHEM 8, ED
BUN: 16 mg/dL (ref 8–23)
Calcium, Ion: 1.09 mmol/L — ABNORMAL LOW (ref 1.15–1.40)
Chloride: 97 mmol/L — ABNORMAL LOW (ref 98–111)
Creatinine, Ser: 0.7 mg/dL (ref 0.61–1.24)
Glucose, Bld: 111 mg/dL — ABNORMAL HIGH (ref 70–99)
HCT: 45 % (ref 39.0–52.0)
Hemoglobin: 15.3 g/dL (ref 13.0–17.0)
Potassium: 3.4 mmol/L — ABNORMAL LOW (ref 3.5–5.1)
Sodium: 131 mmol/L — ABNORMAL LOW (ref 135–145)
TCO2: 23 mmol/L (ref 22–32)

## 2022-01-22 LAB — ETHANOL: Alcohol, Ethyl (B): <10 mg/dL (ref <10)

## 2022-01-22 LAB — PROTIME-INR
INR: 1.1 (ref 0.8–1.2)
Prothrombin Time: 13.7 seconds (ref 11.4–15.2)

## 2022-01-22 LAB — SAMPLE TO BLOOD BANK

## 2022-01-22 MED ORDER — LIDOCAINE-EPINEPHRINE (PF) 2 %-1:200000 IJ SOLN
INTRAMUSCULAR | Status: AC
  Filled 2022-01-22: qty 20

## 2022-01-22 MED ORDER — SODIUM CHLORIDE 0.9 % IV BOLUS
500.0000 mL | Freq: Once | INTRAVENOUS | Status: AC
  Administered 2022-01-22: 500 mL via INTRAVENOUS

## 2022-01-22 MED ORDER — LIDOCAINE HCL (PF) 1 % IJ SOLN
INTRAMUSCULAR | Status: AC
  Filled 2022-01-22: qty 30
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
## 2022-01-22 NOTE — ED Triage Notes (Signed)
Pt to ER via EMS from restaurant where he tripped over a rug falling and hitting head on a wooden bench.  Pt takes plavix, wound to right forehead, bleeding controlled, bleeding started again with arterial spray on moving pt.  Pressure applied

## 2022-01-22 NOTE — Discharge Instructions (Addendum)
Thank you for coming to [facility] Emergency Department. You were seen for fall and head trauma. We did an exam, labs, and imaging, and these showed no acute findings.  Your finger possibly has a tendon injury. It was splinted here in the ED. Please call your orthopedic surgeon to follow up about this. Your head laceration was repaired with 3 sutures which will need to be removed in 7 days. Please follow up with your primary care provider within 1 week.   Do not hesitate to return to the ED or call 911 if you experience: -Worsening headache, blurry vision, confusion, nausea/vomiting -Swelling, redness, pus drainage from sutured wound -Worsening pain, swelling, or inability to move your finger -Lightheadedness, passing out -Fevers/chills -Anything else that concerns you

## 2022-01-22 NOTE — Progress Notes (Signed)
Orthopedic Tech Progress Note Patient Details:  Shawn Hart 1941-04-29 [id number]  Patient ID: Shawn Hart, male   DOB: [birth year], 81 y.o.   MRN: [id number] Level II; not needed. Vernona Rieger 01/22/2022, 1:46 PM
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
## 2022-02-21 ENCOUNTER — Ambulatory Visit: Payer: Self-pay | Admitting: Urology

## 2022-02-21 ENCOUNTER — Other Ambulatory Visit: Payer: Self-pay | Admitting: *Deleted

## 2022-02-21 ENCOUNTER — Ambulatory Visit: Payer: Medicare HMO | Admitting: Urology

## 2022-02-21 ENCOUNTER — Other Ambulatory Visit
Admission: RE | Admit: 2022-02-21 | Discharge: 2022-02-21 | Disposition: A | Discharge status: Home / Self Care | Payer: Medicare HMO | Attending: Urology | Admitting: Urology

## 2022-02-21 ENCOUNTER — Encounter: Payer: Self-pay | Admitting: Urology

## 2022-02-21 VITALS — BP 143/81 | HR 91 | Ht 70.0 in | Wt 245.0 lb

## 2022-02-21 DIAGNOSIS — R350 Frequency of micturition: Secondary | ICD-10-CM | POA: Diagnosis present

## 2022-02-21 DIAGNOSIS — R351 Nocturia: Secondary | ICD-10-CM | POA: Diagnosis not present

## 2022-02-21 DIAGNOSIS — R3915 Urgency of urination: Secondary | ICD-10-CM

## 2022-02-21 LAB — URINALYSIS, COMPLETE (UACMP) WITH MICROSCOPIC
Bacteria, UA: NONE SEEN
Bilirubin Urine: NEGATIVE
Glucose, UA: NEGATIVE mg/dL
Hgb urine dipstick: NEGATIVE
Ketones, ur: NEGATIVE mg/dL
Leukocytes,Ua: NEGATIVE
Nitrite: NEGATIVE
Protein, ur: NEGATIVE mg/dL
RBC / HPF: NONE SEEN RBC/hpf (ref 0–5)
Specific Gravity, Urine: 1.015 (ref 1.005–1.030)
Squamous Epithelial / HPF: NONE SEEN (ref 0–5)
WBC, UA: NONE SEEN WBC/hpf (ref 0–5)
pH: 7 (ref 5.0–8.0)

## 2022-02-21 LAB — BLADDER SCAN AMB NON-IMAGING

## 2022-02-21 MED ORDER — TAMSULOSIN HCL 0.4 MG PO CAPS: 0.4000 mg | ORAL_CAPSULE | Freq: Every day | ORAL | 3 refills | Status: DC
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
## 2022-02-21 NOTE — Patient Instructions (Signed)
Minimize fluids 4 to 5 hours prior to going to bed, try to urinate twice right before going to bed.  You can wear lower leg compression socks to prevent swelling, or elevate your legs in the late afternoon to help prevent some of the overnight urination.  I would also very strongly recommend being evaluated for sleep apnea, as this often can contribute to overnight urination.   Nocturia refers to the need to wake up during the night to urinate, which can disrupt your sleep and impact your overall well-being. Fortunately, there are several strategies you can employ to help prevent or manage nocturia. It's important to consult with your healthcare provider before making any significant changes to your routine. Here are some helpful strategies to consider:  Limit Fluid Intake Before Bed: Avoid drinking large amounts of fluids in the evening, especially within a few hours of bedtime. Consume most of your daily fluid intake earlier in the day to reduce the need to urinate at night.  Monitor Your Diet: Limit your intake of caffeine and alcohol, as these substances can increase urine production and irritate the bladder.  Avoid diet, "zero calorie," and artificially sweetened drinks, especially sodas, in the afternoon or evening. Be mindful of consuming foods and drinks with high water content before bedtime, such as watermelon and herbal teas.  Time Your Medications: If you're taking medications that contribute to increased urination, consult your healthcare provider about adjusting the timing of these medications to minimize their impact during the night.  Practice Double Voiding: Before going to bed, make an effort to empty your bladder twice within a short period. This can help reduce the amount of urine left in your bladder before sleep.  Bladder Training: Gradually increase the time between bathroom visits during the day to train your bladder to hold larger volumes of urine. Over time, this can  help reduce the frequency of nighttime awakenings to urinate.  Elevate Your Legs During the Day: Elevating your legs during the day can help minimize fluid retention in your lower extremities, which might reduce nighttime urination.  Pelvic Floor Exercises: Strengthening your pelvic floor muscles through Kegel exercises can help improve bladder control and potentially reduce the urge to urinate at night.  Create a Relaxing Bedtime Routine: Stress and anxiety can exacerbate nocturia. Engage in calming activities before bed, such as reading, listening to soothing music, or practicing relaxation techniques.  Stay Active: Engage in regular physical activity, but avoid intense exercise close to bedtime, as this can increase your body's demand for fluids.  Maintain a Healthy Weight: Excess weight can compress the bladder and contribute to bladder and urinary issues. Aim to achieve and maintain a healthy weight through a balanced diet and regular exercise.  Remember that every individual is unique, and the effectiveness of these strategies may vary. It's important to work with your healthcare provider to develop a plan that suits your specific needs and addresses any underlying causes of nocturia.

## 2022-03-30 ENCOUNTER — Ambulatory Visit: Payer: Medicare HMO | Admitting: Urology

## 2022-03-30 ENCOUNTER — Encounter: Payer: Self-pay | Admitting: Urology

## 2022-03-30 VITALS — BP 144/81 | HR 81 | Ht 71.0 in | Wt 226.0 lb

## 2022-03-30 DIAGNOSIS — N401 Enlarged prostate with lower urinary tract symptoms: Secondary | ICD-10-CM | POA: Diagnosis not present

## 2022-03-30 DIAGNOSIS — R350 Frequency of micturition: Secondary | ICD-10-CM | POA: Diagnosis not present

## 2022-03-30 DIAGNOSIS — N138 Other obstructive and reflux uropathy: Secondary | ICD-10-CM | POA: Diagnosis not present

## 2022-03-30 LAB — BLADDER SCAN AMB NON-IMAGING

## 2022-03-30 MED ORDER — TAMSULOSIN HCL 0.4 MG PO CAPS: 0.4000 mg | ORAL_CAPSULE | Freq: Every day | ORAL | 11 refills | Status: DC

## 2022-03-30 NOTE — Progress Notes (Signed)
   03/30/2022 2:32 PM   Shawn Hart Feb 23, 1941 [id number]  Reason for visit: Follow up nocturia, LUTS  HPI: 81 year old comorbid male with obesity and BMI of 31, diabetes, CHF who presented in October with nocturia 2-3 times overnight, occasional urgency and urge incontinence.  He has never been evaluated for sleep apnea.  At our last visit we opted for a trial of Flomax as well as discussed behavioral strategies including minimizing diet soda and tea.  He has noticed significant improvement on the Flomax and nocturia has improved to 0-1 time overnight.  He still has some occasional urgency, but this seems to be associated with diet soda and tea intake.  He is satisfied with his urinary symptoms at this time.  PVR is normal at 140m.  Behavioral strategies reviewed again, would still recommend evaluation for sleep apnea.  Flomax refilled.  RTC 1 year PVR  BBilley Co MD  [facility] [place] [place] [phone]

## 2022-03-30 NOTE — Patient Instructions (Signed)

## 2022-05-25 ENCOUNTER — Telehealth: Payer: Self-pay | Admitting: Urology

## 2022-05-25 DIAGNOSIS — N138 Other obstructive and reflux uropathy: Secondary | ICD-10-CM

## 2022-05-25 NOTE — Telephone Encounter (Signed)
Patient would like to have a 90 day supply for his next refill of Tamsulosin. He states it is less expensive (he can get 3 months for the cost of 2 months). He just picked up his last 30 day refill today. Pharmacy is [facility].

## 2022-05-29 MED ORDER — TAMSULOSIN HCL 0.4 MG PO CAPS: 0.4000 mg | ORAL_CAPSULE | Freq: Every day | ORAL | 3 refills | Status: AC

## 2022-05-29 NOTE — Telephone Encounter (Signed)
Rx changed to 90 day supply 

## 2023-03-21 ENCOUNTER — Ambulatory Visit: Payer: Medicare HMO | Admitting: Urology

## 2023-03-28 ENCOUNTER — Ambulatory Visit: Payer: Medicare HMO | Admitting: Urology

## 2023-05-10 ENCOUNTER — Other Ambulatory Visit (INDEPENDENT_AMBULATORY_CARE_PROVIDER_SITE_OTHER): Payer: Self-pay | Admitting: Nurse Practitioner

## 2023-05-10 DIAGNOSIS — R600 Localized edema: Secondary | ICD-10-CM

## 2023-05-11 ENCOUNTER — Ambulatory Visit
Admission: RE | Admit: 2023-05-11 | Discharge: 2023-05-11 | Disposition: A | Discharge status: Home / Self Care | Payer: Medicare HMO | Source: Ambulatory Visit | Attending: Nurse Practitioner | Admitting: Nurse Practitioner

## 2023-05-11 ENCOUNTER — Ambulatory Visit (INDEPENDENT_AMBULATORY_CARE_PROVIDER_SITE_OTHER): Payer: Medicare HMO

## 2023-05-11 ENCOUNTER — Ambulatory Visit
Admission: RE | Admit: 2023-05-11 | Discharge: 2023-05-11 | Disposition: A | Discharge status: Home / Self Care | Payer: Medicare HMO | Source: Ambulatory Visit | Attending: Nurse Practitioner | Admitting: *Deleted

## 2023-05-11 ENCOUNTER — Ambulatory Visit (INDEPENDENT_AMBULATORY_CARE_PROVIDER_SITE_OTHER): Payer: Medicare HMO | Admitting: Nurse Practitioner

## 2023-05-11 ENCOUNTER — Encounter (INDEPENDENT_AMBULATORY_CARE_PROVIDER_SITE_OTHER): Payer: Self-pay | Admitting: Nurse Practitioner

## 2023-05-11 VITALS — BP 111/75 | HR 98 | Resp 16 | Ht 71.0 in | Wt 239.0 lb

## 2023-05-11 DIAGNOSIS — R600 Localized edema: Secondary | ICD-10-CM | POA: Diagnosis present

## 2023-05-11 DIAGNOSIS — I890 Lymphedema, not elsewhere classified: Secondary | ICD-10-CM | POA: Diagnosis not present

## 2023-05-11 DIAGNOSIS — I10 Essential (primary) hypertension: Secondary | ICD-10-CM
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
## 2023-09-18 ENCOUNTER — Encounter (INDEPENDENT_AMBULATORY_CARE_PROVIDER_SITE_OTHER): Payer: Self-pay

## 2023-11-09 ENCOUNTER — Ambulatory Visit (INDEPENDENT_AMBULATORY_CARE_PROVIDER_SITE_OTHER): Payer: Medicare HMO | Admitting: Vascular Surgery

## 2023-11-13 ENCOUNTER — Ambulatory Visit: Admitting: Anesthesiology

## 2023-11-13 ENCOUNTER — Other Ambulatory Visit: Payer: Self-pay

## 2023-11-13 ENCOUNTER — Ambulatory Visit
Admission: RE | Admit: 2023-11-13 | Discharge: 2023-11-13 | Disposition: A | Discharge status: Home / Self Care | Attending: Gastroenterology | Admitting: Gastroenterology

## 2023-11-13 ENCOUNTER — Encounter
Admission: RE | Disposition: A | Discharge status: Home / Self Care | Payer: Self-pay | Source: Home / Self Care | Attending: Gastroenterology

## 2023-11-13 DIAGNOSIS — Z955 Presence of coronary angioplasty implant and graft: Secondary | ICD-10-CM | POA: Insufficient documentation

## 2023-11-13 DIAGNOSIS — Z8719 Personal history of other diseases of the digestive system: Secondary | ICD-10-CM | POA: Insufficient documentation

## 2023-11-13 DIAGNOSIS — I2510 Atherosclerotic heart disease of native coronary artery without angina pectoris: Secondary | ICD-10-CM | POA: Diagnosis not present

## 2023-11-13 DIAGNOSIS — E785 Hyperlipidemia, unspecified: Secondary | ICD-10-CM | POA: Insufficient documentation

## 2023-11-13 DIAGNOSIS — I110 Hypertensive heart disease with heart failure: Secondary | ICD-10-CM | POA: Diagnosis not present

## 2023-11-13 DIAGNOSIS — Z7902 Long term (current) use of antithrombotics/antiplatelets: Secondary | ICD-10-CM | POA: Diagnosis not present

## 2023-11-13 DIAGNOSIS — K449 Diaphragmatic hernia without obstruction or gangrene: Secondary | ICD-10-CM | POA: Diagnosis not present

## 2023-11-13 DIAGNOSIS — K317 Polyp of stomach and duodenum: Secondary | ICD-10-CM | POA: Diagnosis not present

## 2023-11-13 DIAGNOSIS — K219 Gastro-esophageal reflux disease without esophagitis: Secondary | ICD-10-CM | POA: Diagnosis not present

## 2023-11-13 DIAGNOSIS — I509 Heart failure, unspecified: Secondary | ICD-10-CM | POA: Diagnosis not present

## 2023-11-13 DIAGNOSIS — G709 Myoneural disorder, unspecified: Secondary | ICD-10-CM | POA: Insufficient documentation

## 2023-11-13 HISTORY — PX: ESOPHAGOGASTRODUODENOSCOPY: SHX5428

## 2023-11-13 SURGERY — EGD (ESOPHAGOGASTRODUODENOSCOPY)
Anesthesia: General

## 2023-11-13 MED ORDER — LIDOCAINE HCL (CARDIAC) PF 100 MG/5ML IV SOSY
PREFILLED_SYRINGE | INTRAVENOUS | Status: DC | PRN
  Administered 2023-11-13: 80 mg via INTRAVENOUS

## 2023-11-13 MED ORDER — DEXMEDETOMIDINE HCL IN NACL 80 MCG/20ML IV SOLN
INTRAVENOUS | Status: DC | PRN
  Administered 2023-11-13: 8 ug via INTRAVENOUS

## 2023-11-13 MED ORDER — PROPOFOL 10 MG/ML IV BOLUS
INTRAVENOUS | Status: DC | PRN
Start: 2023-11-13 — End: 2023-11-13
  Administered 2023-11-13: 30 mg via INTRAVENOUS
  Administered 2023-11-13: 50 mg via INTRAVENOUS

## 2023-11-13 MED ORDER — PROPOFOL 500 MG/50ML IV EMUL
INTRAVENOUS | Status: DC | PRN
  Administered 2023-11-13: 50 ug/kg/min via INTRAVENOUS

## 2023-11-13 MED ORDER — SODIUM CHLORIDE 0.9 % IV SOLN: INTRAVENOUS | Status: DC
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
## 2023-11-13 NOTE — Interval H&P Note (Signed)
 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  The various methods of treatment have been discussed with the patient and family. After consideration of risks, benefits and other options for treatment, the patient has consented to  Procedure(s) with comments: EGD (ESOPHAGOGASTRODUODENOSCOPY) (N/A) - On plavix as a surgical intervention.  The patient's history has been reviewed, patient examined, no change in status, stable for surgery.  I have reviewed the patient's chart and labs.  Questions were answered to the patient's satisfaction.     Ole ONEIDA Schick  Ok to proceed with EGD

## 2023-11-13 NOTE — Anesthesia Postprocedure Evaluation (Signed)
 Anesthesia Post Note  Patient: Field Staniszewski Schall  Procedure(s) Performed: EGD (ESOPHAGOGASTRODUODENOSCOPY)  Patient location during evaluation: Endoscopy Anesthesia Type: General Level of consciousness: awake and alert Pain management: pain level controlled Vital Signs Assessment: post-procedure vital signs reviewed and stable Respiratory status: spontaneous breathing, nonlabored ventilation, respiratory function stable and patient connected to nasal cannula oxygen Cardiovascular status: blood pressure returned to baseline and stable Postop Assessment: no apparent nausea or vomiting Anesthetic complications: no   No notable events documented.   Last Vitals:  Vitals:   11/13/23 1323 11/13/23 1324  BP: 116/73   Pulse: 74 68  Resp: 19 (!) 24  Temp:    SpO2: 93% 95%    Last Pain:  Vitals:   11/13/23 1307  TempSrc: Temporal  PainSc:                  Lendia LITTIE Mae
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
## 2023-11-13 NOTE — Transfer of Care (Signed)
 Immediate Anesthesia Transfer of Care Note  Patient: Shawn Hart  Procedure(s) Performed: EGD (ESOPHAGOGASTRODUODENOSCOPY)  Patient Location: PACU  Anesthesia Type:General  Level of Consciousness: sedated  Airway & Oxygen Therapy: Patient Spontanous Breathing  Post-op Assessment: Report given to RN and Post -op Vital signs reviewed and stable  Post vital signs: Reviewed and stable  Last Vitals:  Vitals Value Taken Time  BP 118/75 11/13/23 13:09  Temp 35.8 C 11/13/23 13:07  Pulse 84 11/13/23 13:10  Resp 13 11/13/23 13:10  SpO2 93 % 11/13/23 13:10  Vitals shown include unfiled device data.  Last Pain:  Vitals:   11/13/23 1307  TempSrc: Temporal  PainSc:          Complications: No notable events documented.

## 2023-11-13 NOTE — Op Note (Signed)
 [facility] Gastroenterology Patient Name: Shawn Hart Procedure Date: 11/13/2023 12:08 PM MRN: [id number] Account #: [id number] Date of Birth: [birth year] Admit Type: Outpatient Age: 83 Room: [facility] ENDO ROOM 1 Gender: Male Note Status: Finalized Instrument Name: Upper Endoscope [id number] Procedure:             Upper GI endoscopy Indications:           Barrett's esophagus Providers:             Ole Schick MD, MD Medicines:             Monitored Anesthesia Care Complications:         No immediate complications. Estimated blood loss:                         Minimal. Procedure:             Pre-Anesthesia Assessment:                        - Prior to the procedure, a History and Physical was                         performed, and patient medications and allergies were                         reviewed. The patient is competent. The risks and                         benefits of the procedure and the sedation options and                         risks were discussed with the patient. All questions                         were answered and informed consent was obtained.                         Patient identification and proposed procedure were                         verified by the physician, the nurse, the                         anesthesiologist, the anesthetist and the technician                         in the endoscopy suite. Mental Status Examination:                         alert and oriented. Airway Examination: normal                         oropharyngeal airway and neck mobility. Respiratory                         Examination: clear to auscultation. CV Examination:                         normal. Prophylactic Antibiotics: The patient does not  require prophylactic antibiotics. Prior                         Anticoagulants: The patient has taken no anticoagulant                         or antiplatelet agents. ASA Grade Assessment: III -  A                         patient with severe systemic disease. After reviewing                         the risks and benefits, the patient was deemed in                         satisfactory condition to undergo the procedure. The                         anesthesia plan was to use monitored anesthesia care                         (MAC). Immediately prior to administration of                         medications, the patient was re-assessed for adequacy                         to receive sedatives. The heart rate, respiratory                         rate, oxygen saturations, blood pressure, adequacy of                         pulmonary ventilation, and response to care were                         monitored throughout the procedure. The physical                         status of the patient was re-assessed after the                         procedure.                        After obtaining informed consent, the endoscope was                         passed under direct vision. Throughout the procedure,                         the patient's blood pressure, pulse, and oxygen                         saturations were monitored continuously. The Endoscope                         was introduced through the mouth, and advanced to the  second part of duodenum. The upper GI endoscopy was                         accomplished without difficulty. The patient tolerated                         the procedure well. Findings:      Salmon-colored mucosa was present. No other visible abnormalities were       present. The maximum longitudinal extent of these esophageal mucosal       changes was 1 cm in length. Biopsies were taken with a cold forceps for       histology. Estimated blood loss was minimal.      A small hiatal hernia was present.      Multiple small sessile fundic gland polyps with no stigmata of recent       bleeding were found in the gastric body.      The examined duodenum  was normal. Impression:            - Salmon-colored mucosa suspicious for short-segment                         Barrett's esophagus. Biopsied.                        - Small hiatal hernia.                        - Multiple fundic gland polyps.                        - Normal examined duodenum. Recommendation:        - Discharge patient to home.                        - Resume previous diet.                        - Continue present medications.                        - Await pathology results. If unremarkable then I                         would recommend no further upper endoscopies.                        - Return to referring physician as previously                         scheduled. Procedure Code(s):     --- Professional ---                        [phone], Esophagogastroduodenoscopy, flexible,                         transoral; with biopsy, single or multiple Diagnosis Code(s):     --- Professional ---                        K22.70, Barrett's esophagus without dysplasia  K44.9, Diaphragmatic hernia without obstruction or                         gangrene                        K31.7, Polyp of stomach and duodenum CPT copyright 2022 [employer]. All rights reserved. The codes documented in this report are preliminary and upon coder review may  be revised to meet current compliance requirements. Ole Schick MD, MD 11/13/2023 1:06:53 PM Number of Addenda: 0 Note Initiated On: 11/13/2023 12:08 PM Estimated Blood Loss:  Estimated blood loss was minimal.      [facility]

## 2023-11-14 ENCOUNTER — Encounter: Payer: Self-pay | Admitting: Gastroenterology

## 2023-11-14 LAB — SURGICAL PATHOLOGY

## 2023-12-25 ENCOUNTER — Inpatient Hospital Stay
Admission: EM | Admit: 2023-12-25 | Discharge: 2023-12-31 | DRG: 871 | Disposition: A | Discharge status: Home Health | Attending: Student | Admitting: Student

## 2023-12-25 ENCOUNTER — Emergency Department

## 2023-12-25 ENCOUNTER — Other Ambulatory Visit: Payer: Self-pay

## 2023-12-25 DIAGNOSIS — Z6832 Body mass index (BMI) 32.0-32.9, adult: Secondary | ICD-10-CM

## 2023-12-25 DIAGNOSIS — E785 Hyperlipidemia, unspecified: Secondary | ICD-10-CM | POA: Diagnosis present

## 2023-12-25 DIAGNOSIS — I4891 Unspecified atrial fibrillation: Secondary | ICD-10-CM | POA: Diagnosis present

## 2023-12-25 DIAGNOSIS — Z888 Allergy status to other drugs, medicaments and biological substances status: Secondary | ICD-10-CM

## 2023-12-25 DIAGNOSIS — I445 Left posterior fascicular block: Secondary | ICD-10-CM | POA: Diagnosis present

## 2023-12-25 DIAGNOSIS — Z1152 Encounter for screening for COVID-19: Secondary | ICD-10-CM

## 2023-12-25 DIAGNOSIS — N400 Enlarged prostate without lower urinary tract symptoms: Secondary | ICD-10-CM | POA: Diagnosis present

## 2023-12-25 DIAGNOSIS — K8020 Calculus of gallbladder without cholecystitis without obstruction: Secondary | ICD-10-CM

## 2023-12-25 DIAGNOSIS — I480 Paroxysmal atrial fibrillation: Secondary | ICD-10-CM | POA: Diagnosis present

## 2023-12-25 DIAGNOSIS — Z7901 Long term (current) use of anticoagulants: Secondary | ICD-10-CM

## 2023-12-25 DIAGNOSIS — Z823 Family history of stroke: Secondary | ICD-10-CM

## 2023-12-25 DIAGNOSIS — K760 Fatty (change of) liver, not elsewhere classified: Secondary | ICD-10-CM | POA: Diagnosis present

## 2023-12-25 DIAGNOSIS — G9340 Encephalopathy, unspecified: Secondary | ICD-10-CM | POA: Diagnosis not present

## 2023-12-25 DIAGNOSIS — K8000 Calculus of gallbladder with acute cholecystitis without obstruction: Secondary | ICD-10-CM | POA: Diagnosis present

## 2023-12-25 DIAGNOSIS — G629 Polyneuropathy, unspecified: Secondary | ICD-10-CM | POA: Diagnosis present

## 2023-12-25 DIAGNOSIS — R7989 Other specified abnormal findings of blood chemistry: Secondary | ICD-10-CM | POA: Diagnosis not present

## 2023-12-25 DIAGNOSIS — E876 Hypokalemia: Secondary | ICD-10-CM | POA: Diagnosis not present

## 2023-12-25 DIAGNOSIS — G9341 Metabolic encephalopathy: Secondary | ICD-10-CM | POA: Diagnosis present

## 2023-12-25 DIAGNOSIS — A4151 Sepsis due to Escherichia coli [E. coli]: Principal | ICD-10-CM | POA: Diagnosis present

## 2023-12-25 DIAGNOSIS — E639 Nutritional deficiency, unspecified: Secondary | ICD-10-CM | POA: Diagnosis present

## 2023-12-25 DIAGNOSIS — I110 Hypertensive heart disease with heart failure: Secondary | ICD-10-CM | POA: Diagnosis present

## 2023-12-25 DIAGNOSIS — E66811 Obesity, class 1: Secondary | ICD-10-CM | POA: Diagnosis present

## 2023-12-25 DIAGNOSIS — I429 Cardiomyopathy, unspecified: Secondary | ICD-10-CM | POA: Diagnosis present

## 2023-12-25 DIAGNOSIS — I6523 Occlusion and stenosis of bilateral carotid arteries: Secondary | ICD-10-CM | POA: Diagnosis present

## 2023-12-25 DIAGNOSIS — F03918 Unspecified dementia, unspecified severity, with other behavioral disturbance: Secondary | ICD-10-CM | POA: Diagnosis present

## 2023-12-25 DIAGNOSIS — K219 Gastro-esophageal reflux disease without esophagitis: Secondary | ICD-10-CM | POA: Diagnosis present

## 2023-12-25 DIAGNOSIS — A419 Sepsis, unspecified organism: Secondary | ICD-10-CM

## 2023-12-25 DIAGNOSIS — R531 Weakness: Secondary | ICD-10-CM | POA: Diagnosis not present

## 2023-12-25 DIAGNOSIS — H409 Unspecified glaucoma: Secondary | ICD-10-CM | POA: Diagnosis present

## 2023-12-25 DIAGNOSIS — Z955 Presence of coronary angioplasty implant and graft: Secondary | ICD-10-CM

## 2023-12-25 DIAGNOSIS — E871 Hypo-osmolality and hyponatremia: Secondary | ICD-10-CM | POA: Diagnosis present

## 2023-12-25 DIAGNOSIS — I509 Heart failure, unspecified: Secondary | ICD-10-CM | POA: Diagnosis present

## 2023-12-25 DIAGNOSIS — Z79899 Other long term (current) drug therapy: Secondary | ICD-10-CM

## 2023-12-25 DIAGNOSIS — Z8249 Family history of ischemic heart disease and other diseases of the circulatory system: Secondary | ICD-10-CM

## 2023-12-25 DIAGNOSIS — Z886 Allergy status to analgesic agent status: Secondary | ICD-10-CM

## 2023-12-25 DIAGNOSIS — I10 Essential (primary) hypertension: Secondary | ICD-10-CM | POA: Insufficient documentation

## 2023-12-25 DIAGNOSIS — Z7902 Long term (current) use of antithrombotics/antiplatelets: Secondary | ICD-10-CM

## 2023-12-25 DIAGNOSIS — I2510 Atherosclerotic heart disease of native coronary artery without angina pectoris: Secondary | ICD-10-CM | POA: Diagnosis present

## 2023-12-25 LAB — URINALYSIS, W/ REFLEX TO CULTURE (INFECTION SUSPECTED)
Bacteria, UA: NONE SEEN
Bilirubin Urine: NEGATIVE
Glucose, UA: NEGATIVE mg/dL
Hgb urine dipstick: NEGATIVE
Ketones, ur: NEGATIVE mg/dL
Leukocytes,Ua: NEGATIVE
Nitrite: NEGATIVE
Protein, ur: NEGATIVE mg/dL
Specific Gravity, Urine: 1.016 (ref 1.005–1.030)
pH: 7 (ref 5.0–8.0)

## 2023-12-25 LAB — COMPREHENSIVE METABOLIC PANEL WITH GFR
ALT: 468 U/L — ABNORMAL HIGH (ref 0–44)
AST: 576 U/L — ABNORMAL HIGH (ref 15–41)
Albumin: 3.9 g/dL (ref 3.5–5.0)
Alkaline Phosphatase: 239 U/L — ABNORMAL HIGH (ref 38–126)
Anion gap: 11 (ref 5–15)
BUN: 22 mg/dL (ref 8–23)
CO2: 22 mmol/L (ref 22–32)
Calcium: 9.2 mg/dL (ref 8.9–10.3)
Chloride: 99 mmol/L (ref 98–111)
Creatinine, Ser: 0.77 mg/dL (ref 0.61–1.24)
GFR, Estimated: >60 mL/min (ref >60)
Glucose, Bld: 120 mg/dL — ABNORMAL HIGH (ref 70–99)
Potassium: 3.1 mmol/L — ABNORMAL LOW (ref 3.5–5.1)
Sodium: 132 mmol/L — ABNORMAL LOW (ref 135–145)
Total Bilirubin: 1.6 mg/dL — ABNORMAL HIGH (ref 0.0–1.2)
Total Protein: 7.5 g/dL (ref 6.5–8.1)

## 2023-12-25 LAB — RESP PANEL BY RT-PCR (RSV, FLU A&B, COVID)  RVPGX2
Influenza A by PCR: NEGATIVE
Influenza B by PCR: NEGATIVE
Resp Syncytial Virus by PCR: NEGATIVE
SARS Coronavirus 2 by RT PCR: NEGATIVE

## 2023-12-25 LAB — CBC WITH DIFFERENTIAL/PLATELET
Abs Immature Granulocytes: 0.1 K/uL — ABNORMAL HIGH (ref 0.00–0.07)
Basophils Absolute: 0 K/uL (ref 0.0–0.1)
Basophils Relative: 0 %
Eosinophils Absolute: 0 K/uL (ref 0.0–0.5)
Eosinophils Relative: 0 %
HCT: 42.6 % (ref 39.0–52.0)
Hemoglobin: 15 g/dL (ref 13.0–17.0)
Immature Granulocytes: 1 %
Lymphocytes Relative: 4 %
Lymphs Abs: 0.6 K/uL — ABNORMAL LOW (ref 0.7–4.0)
MCH: 30.3 pg (ref 26.0–34.0)
MCHC: 35.2 g/dL (ref 30.0–36.0)
MCV: 86.1 fL (ref 80.0–100.0)
Monocytes Absolute: 0.7 K/uL (ref 0.1–1.0)
Monocytes Relative: 4 %
Neutro Abs: 14.1 K/uL — ABNORMAL HIGH (ref 1.7–7.7)
Neutrophils Relative %: 91 %
Platelets: 239 K/uL (ref 150–400)
RBC: 4.95 MIL/uL (ref 4.22–5.81)
RDW: 12.8 % (ref 11.5–15.5)
WBC: 15.5 K/uL — ABNORMAL HIGH (ref 4.0–10.5)
nRBC: 0 % (ref 0.0–0.2)

## 2023-12-25 LAB — PROTIME-INR
INR: 1.1 (ref 0.8–1.2)
Prothrombin Time: 14.5 s (ref 11.4–15.2)

## 2023-12-25 LAB — LACTIC ACID, PLASMA: Lactic Acid, Venous: 1.5 mmol/L (ref 0.5–1.9)

## 2023-12-25 LAB — TROPONIN I (HIGH SENSITIVITY): Troponin I (High Sensitivity): 15 ng/L (ref <18)

## 2023-12-25 MED ORDER — DILTIAZEM HCL-DEXTROSE 125-5 MG/125ML-% IV SOLN (PREMIX)
5.0000 mg/h | INTRAVENOUS | Status: DC
  Filled 2023-12-25: qty 125

## 2023-12-25 MED ORDER — PIPERACILLIN-TAZOBACTAM 3.375 G IVPB 30 MIN
3.3750 g | Freq: Once | INTRAVENOUS | Status: AC
  Administered 2023-12-25: 3.375 g via INTRAVENOUS
  Filled 2023-12-25: qty 50

## 2023-12-25 MED ORDER — DILTIAZEM HCL 25 MG/5ML IV SOLN
20.0000 mg | Freq: Once | INTRAVENOUS | Status: AC
  Administered 2023-12-25: 20 mg via INTRAVENOUS
  Filled 2023-12-25: qty 5

## 2023-12-25 MED ORDER — SODIUM CHLORIDE 0.9 % IV BOLUS
1000.0000 mL | Freq: Once | INTRAVENOUS | Status: AC
  Administered 2023-12-25: 1000 mL via INTRAVENOUS

## 2023-12-25 NOTE — ED Notes (Signed)
 Dr said pt could have water so I got it for him

## 2023-12-25 NOTE — ED Triage Notes (Signed)
 Pt BIB EMS from home with c/o generalized weakness that started a few days ago. Per EMS, neighbor said pt has been more confused over than normal. Pt has delayed responses and forgetfulness. Pt reports nausea and chills. Pt denies headache or urinary symptoms.    100.5 CBG 173 159/80 HR 120 91% on RA
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
## 2023-12-26 ENCOUNTER — Other Ambulatory Visit (HOSPITAL_COMMUNITY): Payer: Self-pay

## 2023-12-26 ENCOUNTER — Observation Stay: Admit: 2023-12-26 | Discharge: 2023-12-26 | Disposition: A | Discharge status: Home / Self Care

## 2023-12-26 DIAGNOSIS — E639 Nutritional deficiency, unspecified: Secondary | ICD-10-CM | POA: Diagnosis present

## 2023-12-26 DIAGNOSIS — K8020 Calculus of gallbladder without cholecystitis without obstruction: Secondary | ICD-10-CM

## 2023-12-26 DIAGNOSIS — K810 Acute cholecystitis: Secondary | ICD-10-CM | POA: Diagnosis not present

## 2023-12-26 DIAGNOSIS — I2510 Atherosclerotic heart disease of native coronary artery without angina pectoris: Secondary | ICD-10-CM | POA: Diagnosis present

## 2023-12-26 DIAGNOSIS — R531 Weakness: Secondary | ICD-10-CM | POA: Diagnosis present

## 2023-12-26 DIAGNOSIS — E876 Hypokalemia: Secondary | ICD-10-CM | POA: Diagnosis present

## 2023-12-26 DIAGNOSIS — I480 Paroxysmal atrial fibrillation: Secondary | ICD-10-CM | POA: Diagnosis present

## 2023-12-26 DIAGNOSIS — K760 Fatty (change of) liver, not elsewhere classified: Secondary | ICD-10-CM | POA: Diagnosis present

## 2023-12-26 DIAGNOSIS — I4891 Unspecified atrial fibrillation: Secondary | ICD-10-CM | POA: Diagnosis not present

## 2023-12-26 DIAGNOSIS — G629 Polyneuropathy, unspecified: Secondary | ICD-10-CM | POA: Diagnosis present

## 2023-12-26 DIAGNOSIS — A4151 Sepsis due to Escherichia coli [E. coli]: Secondary | ICD-10-CM | POA: Diagnosis present

## 2023-12-26 DIAGNOSIS — I10 Essential (primary) hypertension: Secondary | ICD-10-CM | POA: Insufficient documentation

## 2023-12-26 DIAGNOSIS — E785 Hyperlipidemia, unspecified: Secondary | ICD-10-CM | POA: Diagnosis present

## 2023-12-26 DIAGNOSIS — N400 Enlarged prostate without lower urinary tract symptoms: Secondary | ICD-10-CM | POA: Insufficient documentation

## 2023-12-26 DIAGNOSIS — E66811 Obesity, class 1: Secondary | ICD-10-CM | POA: Diagnosis present

## 2023-12-26 DIAGNOSIS — E871 Hypo-osmolality and hyponatremia: Secondary | ICD-10-CM | POA: Diagnosis present

## 2023-12-26 DIAGNOSIS — I429 Cardiomyopathy, unspecified: Secondary | ICD-10-CM | POA: Diagnosis present

## 2023-12-26 DIAGNOSIS — Z6832 Body mass index (BMI) 32.0-32.9, adult: Secondary | ICD-10-CM | POA: Diagnosis not present

## 2023-12-26 DIAGNOSIS — I110 Hypertensive heart disease with heart failure: Secondary | ICD-10-CM | POA: Diagnosis present

## 2023-12-26 DIAGNOSIS — F03918 Unspecified dementia, unspecified severity, with other behavioral disturbance: Secondary | ICD-10-CM | POA: Insufficient documentation

## 2023-12-26 DIAGNOSIS — G9341 Metabolic encephalopathy: Secondary | ICD-10-CM | POA: Diagnosis present

## 2023-12-26 DIAGNOSIS — K8000 Calculus of gallbladder with acute cholecystitis without obstruction: Secondary | ICD-10-CM | POA: Diagnosis present

## 2023-12-26 DIAGNOSIS — I509 Heart failure, unspecified: Secondary | ICD-10-CM | POA: Diagnosis present

## 2023-12-26 DIAGNOSIS — H409 Unspecified glaucoma: Secondary | ICD-10-CM | POA: Diagnosis present

## 2023-12-26 DIAGNOSIS — Z1152 Encounter for screening for COVID-19: Secondary | ICD-10-CM | POA: Diagnosis not present

## 2023-12-26 DIAGNOSIS — R7989 Other specified abnormal findings of blood chemistry: Secondary | ICD-10-CM

## 2023-12-26 DIAGNOSIS — K219 Gastro-esophageal reflux disease without esophagitis: Secondary | ICD-10-CM | POA: Diagnosis present

## 2023-12-26 DIAGNOSIS — I445 Left posterior fascicular block: Secondary | ICD-10-CM | POA: Diagnosis present

## 2023-12-26 DIAGNOSIS — I6523 Occlusion and stenosis of bilateral carotid arteries: Secondary | ICD-10-CM | POA: Diagnosis present

## 2023-12-26 LAB — BLOOD CULTURE ID PANEL (REFLEXED) - BCID2

## 2023-12-26 LAB — HEPATIC FUNCTION PANEL
ALT: 349 U/L — ABNORMAL HIGH (ref 0–44)
AST: 309 U/L — ABNORMAL HIGH (ref 15–41)
Albumin: 3 g/dL — ABNORMAL LOW (ref 3.5–5.0)
Alkaline Phosphatase: 184 U/L — ABNORMAL HIGH (ref 38–126)
Bilirubin, Direct: 0.3 mg/dL — ABNORMAL HIGH (ref 0.0–0.2)
Indirect Bilirubin: 1 mg/dL — ABNORMAL HIGH (ref 0.3–0.9)
Total Bilirubin: 1.3 mg/dL — ABNORMAL HIGH (ref 0.0–1.2)
Total Protein: 6.2 g/dL — ABNORMAL LOW (ref 6.5–8.1)

## 2023-12-26 LAB — ECHOCARDIOGRAM COMPLETE
AR max vel: 2.49 cm2
AV Area VTI: 2.54 cm2
AV Area mean vel: 2.36 cm2
AV Mean grad: 2 mmHg
AV Peak grad: 3.2 mmHg
Ao pk vel: 0.9 m/s
Area-P 1/2: 4.31 cm2
Calc EF: 52.5 %
MV VTI: 1.98 cm2
S' Lateral: 3.5 cm
Single Plane A2C EF: 49 %
Single Plane A4C EF: 57.2 %
Weight: 3760 [oz_av]

## 2023-12-26 LAB — OSMOLALITY: Osmolality: 277 mosm/kg (ref 275–295)

## 2023-12-26 LAB — BASIC METABOLIC PANEL WITH GFR
Anion gap: 7 (ref 5–15)
BUN: 22 mg/dL (ref 8–23)
CO2: 22 mmol/L (ref 22–32)
Calcium: 8.2 mg/dL — ABNORMAL LOW (ref 8.9–10.3)
Chloride: 101 mmol/L (ref 98–111)
Creatinine, Ser: 0.96 mg/dL (ref 0.61–1.24)
GFR, Estimated: >60 mL/min (ref >60)
Glucose, Bld: 129 mg/dL — ABNORMAL HIGH (ref 70–99)
Potassium: 3.6 mmol/L (ref 3.5–5.1)
Sodium: 130 mmol/L — ABNORMAL LOW (ref 135–145)

## 2023-12-26 LAB — PHOSPHORUS: Phosphorus: 3.6 mg/dL (ref 2.5–4.6)

## 2023-12-26 LAB — PROTIME-INR
INR: 1.3 — ABNORMAL HIGH (ref 0.8–1.2)
Prothrombin Time: 16.8 s — ABNORMAL HIGH (ref 11.4–15.2)

## 2023-12-26 LAB — CBC
HCT: 37.3 % — ABNORMAL LOW (ref 39.0–52.0)
Hemoglobin: 13.1 g/dL (ref 13.0–17.0)
MCH: 30.3 pg (ref 26.0–34.0)
MCHC: 35.1 g/dL (ref 30.0–36.0)
MCV: 86.3 fL (ref 80.0–100.0)
Platelets: 217 K/uL (ref 150–400)
RBC: 4.32 MIL/uL (ref 4.22–5.81)
RDW: 13.1 % (ref 11.5–15.5)
WBC: 27.2 K/uL — ABNORMAL HIGH (ref 4.0–10.5)
nRBC: 0 % (ref 0.0–0.2)

## 2023-12-26 LAB — HEPARIN LEVEL (UNFRACTIONATED): Heparin Unfractionated: 0.34 [IU]/mL (ref 0.30–0.70)

## 2023-12-26 LAB — MAGNESIUM
Magnesium: 1.8 mg/dL (ref 1.7–2.4)
Magnesium: 1.6 mg/dL — ABNORMAL LOW (ref 1.7–2.4)

## 2023-12-26 LAB — APTT: aPTT: 46 s — ABNORMAL HIGH (ref 24–36)

## 2023-12-26 LAB — TSH: TSH: 2.166 u[IU]/mL (ref 0.350–4.500)

## 2023-12-26 LAB — T4, FREE: Free T4: 1.03 ng/dL (ref 0.61–1.12)

## 2023-12-26 LAB — ACETAMINOPHEN LEVEL: Acetaminophen (Tylenol), Serum: <10 ug/mL — ABNORMAL LOW (ref 10–30)

## 2023-12-26 MED ORDER — ONDANSETRON HCL 4 MG PO TABS: 4.0000 mg | ORAL_TABLET | Freq: Four times a day (QID) | ORAL | Status: DC | PRN

## 2023-12-26 MED ORDER — ENOXAPARIN SODIUM 60 MG/0.6ML IJ SOSY: 0.5000 mg/kg | PREFILLED_SYRINGE | INTRAMUSCULAR | Status: DC

## 2023-12-26 MED ORDER — PANTOPRAZOLE SODIUM 40 MG PO TBEC
40.0000 mg | DELAYED_RELEASE_TABLET | Freq: Every day | ORAL | Status: DC
  Administered 2023-12-26 – 2023-12-31 (×6): 40 mg via ORAL
  Filled 2023-12-26 (×6): qty 1

## 2023-12-26 MED ORDER — VITAMIN B-12 1000 MCG PO TABS
1000.0000 ug | ORAL_TABLET | Freq: Every day | ORAL | Status: DC
  Administered 2023-12-26 – 2023-12-31 (×6): 1000 ug via ORAL
  Filled 2023-12-26: qty 2
  Filled 2023-12-26 (×5): qty 1

## 2023-12-26 MED ORDER — VITAMIN C 500 MG PO TABS
500.0000 mg | ORAL_TABLET | Freq: Every day | ORAL | Status: DC
  Administered 2023-12-26 – 2023-12-31 (×6): 500 mg via ORAL
  Filled 2023-12-26 (×6): qty 1

## 2023-12-26 MED ORDER — PERFLUTREN LIPID MICROSPHERE
1.0000 mL | INTRAVENOUS | Status: AC | PRN
  Administered 2023-12-26: 2 mL via INTRAVENOUS

## 2023-12-26 MED ORDER — POTASSIUM CHLORIDE 20 MEQ PO PACK
40.0000 meq | PACK | Freq: Once | ORAL | Status: AC
  Administered 2023-12-26: 40 meq via ORAL
  Filled 2023-12-26: qty 2

## 2023-12-26 MED ORDER — AMLODIPINE BESYLATE 5 MG PO TABS: 5.0000 mg | ORAL_TABLET | Freq: Every day | ORAL | Status: DC

## 2023-12-26 MED ORDER — HYDROCHLOROTHIAZIDE 25 MG PO TABS: 25.0000 mg | ORAL_TABLET | Freq: Every day | ORAL | Status: DC

## 2023-12-26 MED ORDER — METOPROLOL TARTRATE 25 MG PO TABS
25.0000 mg | ORAL_TABLET | Freq: Two times a day (BID) | ORAL | Status: DC
  Administered 2023-12-26 – 2023-12-31 (×10): 25 mg via ORAL
  Filled 2023-12-26 (×10): qty 1

## 2023-12-26 MED ORDER — ROSUVASTATIN CALCIUM 20 MG PO TABS: 20.0000 mg | ORAL_TABLET | Freq: Every day | ORAL | Status: DC

## 2023-12-26 MED ORDER — HEPARIN BOLUS VIA INFUSION
4000.0000 [IU] | Freq: Once | INTRAVENOUS | Status: AC
  Administered 2023-12-26: 4000 [IU] via INTRAVENOUS
  Filled 2023-12-26: qty 4000

## 2023-12-26 MED ORDER — METHOCARBAMOL 500 MG PO TABS: 500.0000 mg | ORAL_TABLET | ORAL | Status: DC | PRN

## 2023-12-26 MED ORDER — BRIMONIDINE TARTRATE 0.2 % OP SOLN
1.0000 [drp] | Freq: Two times a day (BID) | OPHTHALMIC | Status: DC
  Administered 2023-12-27 – 2023-12-31 (×8): 1 [drp] via OPHTHALMIC
  Filled 2023-12-26 (×2): qty 5

## 2023-12-26 MED ORDER — ONDANSETRON HCL 4 MG/2ML IJ SOLN
4.0000 mg | Freq: Four times a day (QID) | INTRAMUSCULAR | Status: DC | PRN
Start: 2023-12-26 — End: 2023-12-31

## 2023-12-26 MED ORDER — TRAZODONE HCL 50 MG PO TABS
25.0000 mg | ORAL_TABLET | Freq: Every evening | ORAL | Status: DC | PRN
  Filled 2023-12-26 (×2): qty 1

## 2023-12-26 MED ORDER — ADULT MULTIVITAMIN W/MINERALS CH
1.0000 | ORAL_TABLET | Freq: Every day | ORAL | Status: DC
  Administered 2023-12-26 – 2023-12-31 (×6): 1 via ORAL
  Filled 2023-12-26 (×6): qty 1

## 2023-12-26 MED ORDER — MAGNESIUM HYDROXIDE 400 MG/5ML PO SUSP: 30.0000 mL | Freq: Every day | ORAL | Status: DC | PRN

## 2023-12-26 MED ORDER — CLOPIDOGREL BISULFATE 75 MG PO TABS: 75.0000 mg | ORAL_TABLET | Freq: Every day | ORAL | Status: DC

## 2023-12-26 MED ORDER — AMLODIPINE BESYLATE 5 MG PO TABS
5.0000 mg | ORAL_TABLET | Freq: Every day | ORAL | Status: DC
  Administered 2023-12-26 – 2023-12-27 (×2): 5 mg via ORAL
  Filled 2023-12-26 (×2): qty 1

## 2023-12-26 MED ORDER — AMIODARONE HCL 200 MG PO TABS: 200.0000 mg | ORAL_TABLET | Freq: Every day | ORAL | Status: DC

## 2023-12-26 MED ORDER — AMIODARONE HCL IN DEXTROSE 360-4.14 MG/200ML-% IV SOLN
30.0000 mg/h | INTRAVENOUS | Status: AC
  Filled 2023-12-26: qty 200

## 2023-12-26 MED ORDER — TRAMADOL HCL 50 MG PO TABS: 50.0000 mg | ORAL_TABLET | Freq: Four times a day (QID) | ORAL | Status: DC | PRN

## 2023-12-26 MED ORDER — AMIODARONE HCL 200 MG PO TABS
400.0000 mg | ORAL_TABLET | Freq: Two times a day (BID) | ORAL | Status: DC
  Administered 2023-12-26 – 2023-12-31 (×11): 400 mg via ORAL
  Filled 2023-12-26 (×11): qty 2

## 2023-12-26 MED ORDER — LORATADINE 10 MG PO TABS
10.0000 mg | ORAL_TABLET | ORAL | Status: DC
  Administered 2023-12-28 – 2023-12-30 (×2): 10 mg via ORAL
  Filled 2023-12-26 (×5): qty 1

## 2023-12-26 MED ORDER — DORZOLAMIDE HCL-TIMOLOL MAL 2-0.5 % OP SOLN
1.0000 [drp] | Freq: Two times a day (BID) | OPHTHALMIC | Status: DC
  Administered 2023-12-27 – 2023-12-31 (×8): 1 [drp] via OPHTHALMIC
  Filled 2023-12-26 (×2): qty 10

## 2023-12-26 MED ORDER — POTASSIUM CHLORIDE IN NACL 20-0.9 MEQ/L-% IV SOLN
INTRAVENOUS | Status: DC
  Filled 2023-12-26 (×4): qty 1000

## 2023-12-26 MED ORDER — PREGABALIN 50 MG PO CAPS
50.0000 mg | ORAL_CAPSULE | Freq: Three times a day (TID) | ORAL | Status: DC
Start: 2023-12-26 — End: 2023-12-31
  Administered 2023-12-26 – 2023-12-31 (×16): 50 mg via ORAL
  Filled 2023-12-26 (×16): qty 1

## 2023-12-26 MED ORDER — PIPERACILLIN-TAZOBACTAM 3.375 G IVPB
3.3750 g | Freq: Three times a day (TID) | INTRAVENOUS | Status: DC
  Administered 2023-12-26 – 2023-12-31 (×16): 3.375 g via INTRAVENOUS
  Filled 2023-12-26 (×16): qty 50

## 2023-12-26 MED ORDER — AMIODARONE LOAD VIA INFUSION
150.0000 mg | Freq: Once | INTRAVENOUS | Status: AC
  Administered 2023-12-26: 150 mg via INTRAVENOUS
  Filled 2023-12-26: qty 83.34

## 2023-12-26 MED ORDER — HEPARIN (PORCINE) 25000 UT/250ML-% IV SOLN
1900.0000 [IU]/h | INTRAVENOUS | Status: DC
  Administered 2023-12-26 – 2023-12-27 (×2): 1500 [IU]/h via INTRAVENOUS
  Administered 2023-12-27: 1600 [IU]/h via INTRAVENOUS
  Administered 2023-12-28: 1900 [IU]/h via INTRAVENOUS
  Filled 2023-12-26 (×4): qty 250

## 2023-12-26 MED ORDER — AMIODARONE HCL IN DEXTROSE 360-4.14 MG/200ML-% IV SOLN
60.0000 mg/h | INTRAVENOUS | Status: AC
  Administered 2023-12-26: 60 mg/h via INTRAVENOUS
  Filled 2023-12-26 (×2): qty 200

## 2023-12-26 MED ORDER — LOSARTAN POTASSIUM 50 MG PO TABS: 100.0000 mg | ORAL_TABLET | Freq: Every day | ORAL | Status: DC

## 2023-12-26 MED ORDER — ACETAMINOPHEN 325 MG PO TABS: 650.0000 mg | ORAL_TABLET | Freq: Four times a day (QID) | ORAL | Status: DC | PRN

## 2023-12-26 MED ORDER — ACETAMINOPHEN 650 MG RE SUPP: 650.0000 mg | Freq: Four times a day (QID) | RECTAL | Status: DC | PRN

## 2023-12-26 NOTE — ED Notes (Signed)
 Pt rang out for the bed to be let back so I assisted he with that

## 2023-12-26 NOTE — Assessment & Plan Note (Signed)
Will continue Flomax.

## 2023-12-26 NOTE — Assessment & Plan Note (Signed)
-   Will continue Aricept .

## 2023-12-26 NOTE — ED Notes (Signed)
 CCMD called to place patient onto cardiac monitor.

## 2023-12-26 NOTE — ED Notes (Signed)
 Notified Mansy, MD of significant jump in WBC count via secure chat. Asked about possibly ordering further imaging for infection source. Stated he was going to defer to AM team for more orders
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
## 2023-12-26 NOTE — Progress Notes (Signed)
 PHARMACY - PHYSICIAN COMMUNICATION CRITICAL VALUE ALERT - BLOOD CULTURE IDENTIFICATION (BCID)  Shawn Hart is an 83 y.o. male who presented to [facility] on 12/25/2023 with a chief complaint of altered mental status and weakness.   Assessment:  Blood culture from 8/26 with GNR, BCID detects E coli.  LFTs elevated and RUQ US  showed cholelithiasis.  Surgery consulted and low concern for gallbladder infection currently.    Name of physician (or Provider) Contacted: Dr Von  Current antibiotics: Piperacillin nadine  Changes to prescribed antibiotics recommended:  Patient is on recommended antibiotics - No changes needed  Results for orders placed or performed during the hospital encounter of 12/25/23  Blood Culture ID Panel (Reflexed) (Collected: 12/25/2023  9:09 PM)  Result Value Ref Range   Enterococcus faecalis NOT DETECTED NOT DETECTED   Enterococcus Faecium NOT DETECTED NOT DETECTED   Listeria monocytogenes NOT DETECTED NOT DETECTED   Staphylococcus species NOT DETECTED NOT DETECTED   Staphylococcus aureus (BCID) NOT DETECTED NOT DETECTED   Staphylococcus epidermidis NOT DETECTED NOT DETECTED   Staphylococcus lugdunensis NOT DETECTED NOT DETECTED   Streptococcus species NOT DETECTED NOT DETECTED   Streptococcus agalactiae NOT DETECTED NOT DETECTED   Streptococcus pneumoniae NOT DETECTED NOT DETECTED   Streptococcus pyogenes NOT DETECTED NOT DETECTED   A.calcoaceticus-baumannii NOT DETECTED NOT DETECTED   Bacteroides fragilis NOT DETECTED NOT DETECTED   Enterobacterales DETECTED (A) NOT DETECTED   Enterobacter cloacae complex NOT DETECTED NOT DETECTED   Escherichia coli DETECTED (A) NOT DETECTED   Klebsiella aerogenes NOT DETECTED NOT DETECTED   Klebsiella oxytoca NOT DETECTED NOT DETECTED   Klebsiella pneumoniae NOT DETECTED NOT DETECTED   Proteus species NOT DETECTED NOT DETECTED   Salmonella species NOT DETECTED NOT DETECTED   Serratia marcescens NOT DETECTED NOT  DETECTED   Haemophilus influenzae NOT DETECTED NOT DETECTED   Neisseria meningitidis NOT DETECTED NOT DETECTED   Pseudomonas aeruginosa NOT DETECTED NOT DETECTED   Stenotrophomonas maltophilia NOT DETECTED NOT DETECTED   Candida albicans NOT DETECTED NOT DETECTED   Candida auris NOT DETECTED NOT DETECTED   Candida glabrata NOT DETECTED NOT DETECTED   Candida krusei NOT DETECTED NOT DETECTED   Candida parapsilosis NOT DETECTED NOT DETECTED   Candida tropicalis NOT DETECTED NOT DETECTED   Cryptococcus neoformans/gattii NOT DETECTED NOT DETECTED   CTX-M ESBL NOT DETECTED NOT DETECTED   Carbapenem resistance IMP NOT DETECTED NOT DETECTED   Carbapenem resistance KPC NOT DETECTED NOT DETECTED   Carbapenem resistance NDM NOT DETECTED NOT DETECTED   Carbapenem resist OXA 48 LIKE NOT DETECTED NOT DETECTED   Carbapenem resistance VIM NOT DETECTED NOT DETECTED    Celestine Slovak, PharmD, BCPS, BCIDP Work Cell: [phone] 12/26/2023 1:58 PM

## 2023-12-26 NOTE — ED Notes (Signed)
 ECHO at bedside.

## 2023-12-26 NOTE — Assessment & Plan Note (Addendum)
-   Right upper ultrasound showed cholelithiasis without cholecystitis. - Will follow LFTs with hydration. - Will hold off statin therapy.

## 2023-12-26 NOTE — Assessment & Plan Note (Signed)
-   The patient will be admitted to a progressive unit observation bed. - Will continue him on IV amiodarone  drip.  Is currently converting to normal sinus rhythm. - Will follow serial troponins. - Will obtain a 2D echo and cardiology consult. - I notified Dr. Wilburn about the patient.
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
## 2023-12-26 NOTE — Assessment & Plan Note (Signed)
-   Will follow neurochecks every 4 hours for 24 hours. -She has no focal symptoms or signs.

## 2023-12-26 NOTE — Assessment & Plan Note (Signed)
-   Potassium replacement will be provided.  Magnesium  level was 1.8.

## 2023-12-26 NOTE — TOC Benefit Eligibility Note (Signed)
 Pharmacy Patient Advocate Encounter  Insurance verification completed.    The patient is insured through [employer]. Patient has Medicare and is not eligible for a copay card, but may be able to apply for patient assistance or Medicare RX Payment Plan (Patient Must reach out to their plan, if eligible for payment plan), if available.    Ran test claim for Eliquis 5mg  and the current 30 day co-pay is $30.  Ran test claim for Xarelto 20mg  and the current 30 day co-pay is $30.   This test claim was processed through [employer]- copay amounts may vary at other pharmacies due to [employer], or as the patient moves through the different stages of their insurance plan.

## 2023-12-26 NOTE — Assessment & Plan Note (Signed)
Will continue Lyrica.

## 2023-12-26 NOTE — Progress Notes (Signed)
 [facility] Progress Note  Patient: Shawn Hart    FMW:[id number]  DOA: 12/25/2023     Date of Service: the patient was seen and examined on 12/26/2023  Chief Complaint  Patient presents with   Altered Mental Status   Brief hospital course: Shawn Hart is a 83 y.o. male with medical history significant for osteoarthritis, bilateral carotid artery stenosis, CHF, coronary artery disease, GERD, gout, hypertension and dyslipidemia, presented to the emergency room with acute onset o generalized weakness and altered mental status with confusion.  The patient has been having dry cough with associated wheezing and dyspnea.  No urinary frequency or urgency.  No chest pain or palpitations.  No nausea or vomiting or diarrhea or abdominal pain.  No bleeding diathesis.   ED Course: When the patient came to the ER, temperature was 99.8 and heart rate 118 with respiratory rate of 27 and otherwise normal vital signs.  Heart rate has been as high as 148.  Labs revealed unremarkable UA and negative respiratory panel.  CMP was remarkable for mild hyponatremia and hypokalemia with elevated alk phos of 229, AST 576 and ALT 468 with total bili 1.6.  CBC showed leukocytosis 15.5 with neutrophilia.  INR is 1.1 and PT is 14.5 with Tylenol  level less than 10 and blood glucose 120. EKG as reviewed by me :  EKG showed sinus tachycardia with rate of 117 with PACs, left posterior fascicular block and low voltage QRS and Q waves inferiorly. Imaging: Portable chest x-ray showed no acute cardiopulmonary disease. Noncontrast head CT scan revealed no acute intracranial normalities.  It showed chronic atrophic changes. Right upper quadrant ultrasound revealed cholelithiasis and hepatic steatosis.   The patient was given 20 mg of IV Cardizem , 1 L bolus of IV normal saline, 40 mg of p.o. potassium chloride  as well as IV Zosyn .  I started him on IV amiodarone  with bolus and drip given borderline blood pressure.  He will  be admitted to the progressive unit observation bed for further evaluation and management.    Assessment and Plan:  # Paroxysmal A-fib w/RVR, resolved, currently SR 8/27 converted back to sinus rhythm - s/p IV amiodarone  drip, transition to oral amiodarone  400 twice daily for 7 days followed by 200 mg p.o. daily Started heparin  IV infusion, eventually patient will need to DOAC. Follow-up TTE Cardiology consult appreciated Continue to monitor on telemetry    # Acute metabolic encephalopathy: Resolved - Will follow neurochecks every 4 hours for 24 hours. -She has no focal symptoms or signs. 8/27 patient is back to his baseline  # Hypokalemia: Potassium repleted Magnesium  level was 1.8. Monitor electrolytes daily and replete as needed    # Elevated LFTs - Right upper ultrasound showed cholelithiasis without cholecystitis. - Will follow LFTs with hydration. - Will hold off statin therapy.     # Cholelithiasis - Given associated leukocytosis I will keep the patient on IV Zosyn . - follow blood cultures, growing E. coli, sensitivity report pending UA was negative as well as portable chest x-ray. - General Surgery consult will be obtained. - Dr. Tye following, recommended to hold Plavix  for now for possible lap chole    # Dyslipidemia - We will hold off statin therapy given elevated LFTs.   # Dementia with behavioral disturbance (HCC) - Will continue Aricept.   # Peripheral neuropathy - Will continue Lyrica .   # BPH (benign prostatic hyperplasia) - Will continue Flomax .   # Essential hypertension - Held antihypertensive therapy due to  soft BP Monitor BP and titrate medications accordingly 8/27 started Lopressor  25 mg p.o. twice daily with holding parameters   Body mass index is 32.78 kg/m.  Interventions:   Diet: Heart healthy diet DVT Prophylaxis: Heparin  IV infusion  Advance goals of care discussion: Full code  Family Communication: family was not present  at bedside, at the time of interview.  The pt provided permission to discuss medical plan with the family. Opportunity was given to ask question and all questions were answered satisfactorily.   Disposition:  Pt is from Home, admitted with sepsis, A fib rvr, still on IV Abx and Hep gtt, which precludes a safe discharge. Discharge to Home, when stable, and cleared by cards and general surgery.  Subjective: No significant events overnight.  Patient denied any chest pain or palpitation, no abdominal pain. Patient is back to his baseline.   Physical Exam: General: NAD, lying comfortably Appear in no distress, affect appropriate Eyes: PERRLA ENT: Oral Mucosa Clear, moist  Neck: no JVD,  Cardiovascular: S1 and S2 Present, no Murmur,  Respiratory: good respiratory effort, Bilateral Air entry equal and Decreased, no Crackles, no wheezes Abdomen: BS present, Soft, obese and no tenderness,  Skin: no rashes Extremities: no Pedal edema, no calf tenderness Neurologic: without any new focal findings Gait not checked due to patient safety concerns  Vitals:   12/26/23 1523 12/26/23 1523 12/26/23 1600 12/26/23 1630  BP: 112/68  115/67 (!) 147/82  Pulse:      Resp: 17  (!) 24 (!) 25  Temp:  98.4 F (36.9 C)    TempSrc:  Oral    SpO2:      Weight:        Intake/Output Summary (Last 24 hours) at 12/26/2023 1713 Last data filed at 12/26/2023 1207 Gross per 24 hour  Intake 1050 ml  Output 725 ml  Net 325 ml   Filed Weights   12/25/23 2040  Weight: 106.6 kg    Data Reviewed: I have personally reviewed and interpreted daily labs, tele strips, imagings as discussed above. I reviewed all nursing notes, pharmacy notes, vitals, pertinent old records I have discussed plan of care as described above with RN and patient/family.  CBC: Recent Labs  Lab 12/25/23 2045 12/26/23 0447  WBC 15.5* 27.2*  NEUTROABS 14.1*  --   HGB 15.0 13.1  HCT 42.6 37.3*  MCV 86.1 86.3  PLT 239 217   Basic  Metabolic Panel: Recent Labs  Lab 12/25/23 2045 12/26/23 0447  NA 132* 130*  K 3.1* 3.6  CL 99 101  CO2 22 22  GLUCOSE 120* 129*  BUN 22 22  CREATININE 0.77 0.96  CALCIUM  9.2 8.2*  MG 1.8 1.6*  PHOS  --  3.6    Studies: ECHOCARDIOGRAM COMPLETE Result Date: 12/26/2023    ECHOCARDIOGRAM REPORT   Patient Name:   NASHUA HOMEWOOD Date of Exam: 12/26/2023 Medical Rec #:  [id number]        Height:       71.0 in Accession #:    [id number]       Weight:       235.0 lb Date of Birth:  1940/10/17        BSA:          2.258 m Patient Age:    83 years         BP:           104/63 mmHg Patient Gender: M  HR:           73 bpm. Exam Location:  [facility] Procedure: 2D Echo, Cardiac Doppler, Color Doppler and Intracardiac            Opacification Agent (Both Spectral and Color Flow Doppler were            utilized during procedure). Indications:     Atrial Fibrillation I48.91  History:         Patient has no prior history of Echocardiogram examinations.                  Previous Myocardial Infarction and CAD. Stent in 2007 per                  patient.  Sonographer:     Ashley McNeely-Sloane Referring Phys:  [id number] DORENE COMFORT Diagnosing Phys: Marsa Dooms MD IMPRESSIONS  1. Left ventricular ejection fraction, by estimation, is 45 to 50%. The left ventricle has mildly decreased function. The left ventricle has no regional wall motion abnormalities. Left ventricular diastolic parameters were normal.  2. Right ventricular systolic function is normal. The right ventricular size is normal.  3. The mitral valve is normal in structure. Mild mitral valve regurgitation. No evidence of mitral stenosis.  4. The aortic valve is normal in structure. Aortic valve regurgitation is not visualized. No aortic stenosis is present.  5. The inferior vena cava is normal in size with greater than 50% respiratory variability, suggesting right atrial pressure of 3 mmHg. FINDINGS  Left Ventricle: Left ventricular  ejection fraction, by estimation, is 45 to 50%. The left ventricle has mildly decreased function. The left ventricle has no regional wall motion abnormalities. Definity  contrast agent was given IV to delineate the left ventricular endocardial borders. Strain was performed and the global longitudinal strain is indeterminate. The left ventricular internal cavity size was normal in size. There is no left ventricular hypertrophy. Left ventricular diastolic parameters were normal. Right Ventricle: The right ventricular size is normal. No increase in right ventricular wall thickness. Right ventricular systolic function is normal. Left Atrium: Left atrial size was normal in size. Right Atrium: Right atrial size was normal in size. Pericardium: There is no evidence of pericardial effusion. Mitral Valve: The mitral valve is normal in structure. Mild mitral valve regurgitation. No evidence of mitral valve stenosis. MV peak gradient, 2.8 mmHg. The mean mitral valve gradient is 1.0 mmHg. Tricuspid Valve: The tricuspid valve is normal in structure. Tricuspid valve regurgitation is mild . No evidence of tricuspid stenosis. Aortic Valve: The aortic valve is normal in structure. Aortic valve regurgitation is not visualized. No aortic stenosis is present. Aortic valve mean gradient measures 2.0 mmHg. Aortic valve peak gradient measures 3.2 mmHg. Aortic valve area, by VTI measures 2.54 cm. Pulmonic Valve: The pulmonic valve was normal in structure. Pulmonic valve regurgitation is not visualized. No evidence of pulmonic stenosis. Aorta: The aortic root is normal in size and structure. Venous: The inferior vena cava is normal in size with greater than 50% respiratory variability, suggesting right atrial pressure of 3 mmHg. IAS/Shunts: No atrial level shunt detected by color flow Doppler. Additional Comments: 3D was performed not requiring image post processing on an independent workstation and was indeterminate.  LEFT VENTRICLE PLAX  2D LVIDd:         4.50 cm     Diastology LVIDs:         3.50 cm     LV e' medial:    5.51 cm/s  LV PW:         1.20 cm     LV E/e' medial:  10.6 LV IVS:        1.10 cm     LV e' lateral:   6.57 cm/s LVOT diam:     2.00 cm     LV E/e' lateral: 8.9 LV SV:         45 LV SV Index:   20 LVOT Area:     3.14 cm  LV Volumes (MOD) LV vol d, MOD A2C: 58.6 ml LV vol d, MOD A4C: 89.3 ml LV vol s, MOD A2C: 29.9 ml LV vol s, MOD A4C: 38.2 ml LV SV MOD A2C:     28.7 ml LV SV MOD A4C:     89.3 ml LV SV MOD BP:      38.2 ml RIGHT VENTRICLE RV Basal diam:  5.60 cm RV Mid diam:    4.70 cm RV S prime:     10.30 cm/s TAPSE (M-mode): 2.0 cm LEFT ATRIUM         Index LA diam:    3.20 cm 1.42 cm/m  AORTIC VALVE                    PULMONIC VALVE AV Area (Vmax):    2.49 cm     PV Vmax:        0.75 m/s AV Area (Vmean):   2.36 cm     PV Vmean:       52.100 cm/s AV Area (VTI):     2.54 cm     PV VTI:         0.164 m AV Vmax:           89.80 cm/s   PV Peak grad:   2.2 mmHg AV Vmean:          62.700 cm/s  PV Mean grad:   1.0 mmHg AV VTI:            0.178 m      RVOT Peak grad: 2 mmHg AV Peak Grad:      3.2 mmHg AV Mean Grad:      2.0 mmHg LVOT Vmax:         71.30 cm/s LVOT Vmean:        47.200 cm/s LVOT VTI:          0.144 m LVOT/AV VTI ratio: 0.81  AORTA Ao Root diam: 2.90 cm Ao Asc diam:  3.40 cm MITRAL VALVE               TRICUSPID VALVE MV Area (PHT): 4.31 cm    TR Peak grad:   14.6 mmHg MV Area VTI:   1.98 cm    TR Vmax:        191.00 cm/s MV Peak grad:  2.8 mmHg MV Mean grad:  1.0 mmHg    SHUNTS MV Vmax:       0.84 m/s    Systemic VTI:  0.14 m MV Vmean:      57.1 cm/s   Systemic Diam: 2.00 cm MV Decel Time: 176 msec    Pulmonic VTI:  0.144 m MV E velocity: 58.20 cm/s MV A velocity: 57.60 cm/s MV E/A ratio:  1.01 Marsa Dooms MD Electronically signed by Marsa Dooms MD Signature Date/Time: 12/26/2023/4:08:56 PM    Final    CT Head Wo Contrast Result Date: 12/25/2023 CLINICAL DATA:  Altered mental status EXAM: CT HEAD  WITHOUT  CONTRAST TECHNIQUE: Contiguous axial images were obtained from the base of the skull through the vertex without intravenous contrast. RADIATION DOSE REDUCTION: This exam was performed according to the departmental dose-optimization program which includes automated exposure control, adjustment of the mA and/or kV according to patient size and/or use of iterative reconstruction technique. COMPARISON:  01/22/2022 FINDINGS: Brain: No evidence of acute infarction, hemorrhage, hydrocephalus, extra-axial collection or mass lesion/mass effect. Chronic atrophic changes are noted. Vascular: No hyperdense vessel or unexpected calcification. Skull: Normal. Negative for fracture or focal lesion. Sinuses/Orbits: No acute finding. Other: None. IMPRESSION: Chronic atrophic changes without acute abnormality. Electronically Signed   By: Oneil Devonshire M.D.   On: 12/25/2023 23:18   US  ABDOMEN LIMITED RUQ (LIVER/GB) Result Date: 12/25/2023 CLINICAL DATA:  Elevated liver function tests. EXAM: ULTRASOUND ABDOMEN LIMITED RIGHT UPPER QUADRANT COMPARISON:  None Available. FINDINGS: Gallbladder: A 2.2 cm shadowing echogenic gallstone is seen within the gallbladder lumen. There is no evidence of gallbladder wall thickening (2.9 mm). No sonographic Murphy sign noted by sonographer. Common bile duct: Diameter: 3.2 mm Liver: The left lobe of the liver is limited in visualization secondary to overlying bowel gas. No focal lesion identified. Diffusely increased echogenicity of the liver parenchyma is noted. Portal vein is patent on color Doppler imaging with normal direction of blood flow towards the liver. Other: None. IMPRESSION: 1. Cholelithiasis. 2. Hepatic steatosis. Electronically Signed   By: Suzen Dials M.D.   On: 12/25/2023 23:03   DG Chest Port 1 View Result Date: 12/25/2023 CLINICAL DATA:  Weakness, nausea, and chills.  Tachycardia. EXAM: PORTABLE CHEST 1 VIEW COMPARISON:  01/22/2022 FINDINGS: Shallow inspiration.  Heart size and pulmonary vascularity are normal. Vascular crowding in the lung bases. No airspace disease or consolidation in the lungs. No pleural effusion or pneumothorax. Mediastinal contours appear intact. Degenerative changes in the spine and shoulders. IMPRESSION: No active disease. Electronically Signed   By: Elsie Gravely M.D.   On: 12/25/2023 21:35    Scheduled Meds:  amiodarone   400 mg Oral BID   Followed by   NOREEN ON 01/05/2024] amiodarone   200 mg Oral Daily   ascorbic acid   500 mg Oral Daily   brimonidine   1 drop Right Eye BID   [START ON 12/28/2023] clopidogrel   75 mg Oral Daily   cyanocobalamin   1,000 mcg Oral Daily   dorzolamide -timolol   1 drop Both Eyes BID   loratadine   10 mg Oral QODAY   multivitamin with minerals  1 tablet Oral Daily   pantoprazole   40 mg Oral Daily   pregabalin   50 mg Oral TID   Continuous Infusions:  0.9 % NaCl with KCl 20 mEq / L 100 mL/hr at 12/26/23 1523   heparin  1,500 Units/hr (12/26/23 1050)   piperacillin -tazobactam Stopped (12/26/23 1651)   PRN Meds: acetaminophen  **OR** acetaminophen , magnesium  hydroxide, methocarbamol , ondansetron  **OR** ondansetron  (ZOFRAN ) IV, traZODone   Time spent: 55 minutes  Author: ELVAN SOR. MD [facility] 12/26/2023 5:13 PM  To reach On-call, see care teams to locate the attending and reach out to them via [email or web address]. If 7PM-7AM, please contact night-coverage If you still have difficulty reaching the attending provider, please page the [facility] (Director on Call) for [facility] on amion for assistance.
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
## 2023-12-26 NOTE — Plan of Care (Signed)
  Problem: Education: Goal: Knowledge of General Education information will improve Description: Including pain rating scale, medication(s)/side effects and non-pharmacologic comfort measures Outcome: Progressing   Problem: Clinical Measurements: Goal: Respiratory complications will improve Outcome: Progressing   Problem: Clinical Measurements: Goal: Cardiovascular complication will be avoided Outcome: Progressing   Problem: Pain Managment: Goal: General experience of comfort will improve and/or be controlled Outcome: Progressing   Problem: Safety: Goal: Ability to remain free from injury will improve Outcome: Progressing
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
## 2023-12-26 NOTE — Assessment & Plan Note (Addendum)
-   Given associated leukocytosis I will keep the patient on IV Zosyn . - Will follow blood cultures.  UA was negative as well as portable chest x-ray. - General Surgery consult will be obtained. - Dr. Tye was notified about the patient.

## 2023-12-26 NOTE — Assessment & Plan Note (Signed)
-   Will continue antihypertensive therapy.

## 2023-12-26 NOTE — Assessment & Plan Note (Addendum)
-   We will hold off statin therapy given elevated LFTs

## 2023-12-26 NOTE — ED Notes (Signed)
 Per cardiology, d/c amiodarone  gtt at 1400.

## 2023-12-27 ENCOUNTER — Inpatient Hospital Stay

## 2023-12-27 ENCOUNTER — Other Ambulatory Visit (HOSPITAL_COMMUNITY): Payer: Self-pay

## 2023-12-27 ENCOUNTER — Other Ambulatory Visit: Payer: Self-pay

## 2023-12-27 DIAGNOSIS — I4891 Unspecified atrial fibrillation: Secondary | ICD-10-CM | POA: Diagnosis not present

## 2023-12-27 LAB — HEPATIC FUNCTION PANEL
ALT: 244 U/L — ABNORMAL HIGH (ref 0–44)
AST: 141 U/L — ABNORMAL HIGH (ref 15–41)
Albumin: 3.3 g/dL — ABNORMAL LOW (ref 3.5–5.0)
Alkaline Phosphatase: 164 U/L — ABNORMAL HIGH (ref 38–126)
Bilirubin, Direct: 0.4 mg/dL — ABNORMAL HIGH (ref 0.0–0.2)
Indirect Bilirubin: 1.3 mg/dL — ABNORMAL HIGH (ref 0.3–0.9)
Total Bilirubin: 1.7 mg/dL — ABNORMAL HIGH (ref 0.0–1.2)
Total Protein: 6.6 g/dL (ref 6.5–8.1)

## 2023-12-27 LAB — CBC
HCT: 40.4 % (ref 39.0–52.0)
Hemoglobin: 13.7 g/dL (ref 13.0–17.0)
MCH: 29.9 pg (ref 26.0–34.0)
MCHC: 33.9 g/dL (ref 30.0–36.0)
MCV: 88.2 fL (ref 80.0–100.0)
Platelets: 222 K/uL (ref 150–400)
RBC: 4.58 MIL/uL (ref 4.22–5.81)
RDW: 13.4 % (ref 11.5–15.5)
WBC: 14.5 K/uL — ABNORMAL HIGH (ref 4.0–10.5)
nRBC: 0 % (ref 0.0–0.2)

## 2023-12-27 LAB — BASIC METABOLIC PANEL WITH GFR
Anion gap: 8 (ref 5–15)
BUN: 14 mg/dL (ref 8–23)
CO2: 21 mmol/L — ABNORMAL LOW (ref 22–32)
Calcium: 8.4 mg/dL — ABNORMAL LOW (ref 8.9–10.3)
Chloride: 106 mmol/L (ref 98–111)
Creatinine, Ser: 0.67 mg/dL (ref 0.61–1.24)
GFR, Estimated: >60 mL/min (ref >60)
Glucose, Bld: 104 mg/dL — ABNORMAL HIGH (ref 70–99)
Potassium: 3.4 mmol/L — ABNORMAL LOW (ref 3.5–5.1)
Sodium: 135 mmol/L (ref 135–145)

## 2023-12-27 LAB — LIPASE, BLOOD: Lipase: 28 U/L (ref 11–51)

## 2023-12-27 LAB — LIPID PANEL
Cholesterol: 96 mg/dL (ref 0–200)
HDL: 31 mg/dL — ABNORMAL LOW (ref >40)
LDL Cholesterol: 48 mg/dL (ref 0–99)
Total CHOL/HDL Ratio: 3.1 ratio
Triglycerides: 85 mg/dL (ref <150)
VLDL: 17 mg/dL (ref 0–40)

## 2023-12-27 LAB — PHOSPHORUS: Phosphorus: 2 mg/dL — ABNORMAL LOW (ref 2.5–4.6)

## 2023-12-27 LAB — HEPARIN LEVEL (UNFRACTIONATED): Heparin Unfractionated: 0.31 [IU]/mL (ref 0.30–0.70)

## 2023-12-27 LAB — MAGNESIUM: Magnesium: 2 mg/dL (ref 1.7–2.4)

## 2023-12-27 MED ORDER — POTASSIUM PHOSPHATES 15 MMOLE/5ML IV SOLN
30.0000 mmol | Freq: Once | INTRAVENOUS | Status: AC
  Administered 2023-12-27: 30 mmol via INTRAVENOUS
  Filled 2023-12-27: qty 10

## 2023-12-27 MED ORDER — LOSARTAN POTASSIUM 50 MG PO TABS
50.0000 mg | ORAL_TABLET | Freq: Every day | ORAL | Status: DC
  Administered 2023-12-28 – 2023-12-31 (×4): 50 mg via ORAL
  Filled 2023-12-27 (×4): qty 1

## 2023-12-27 MED ORDER — POTASSIUM CHLORIDE CRYS ER 20 MEQ PO TBCR
40.0000 meq | EXTENDED_RELEASE_TABLET | Freq: Once | ORAL | Status: AC
  Administered 2023-12-27: 40 meq via ORAL
  Filled 2023-12-27: qty 2

## 2023-12-27 MED ORDER — MORPHINE SULFATE (PF) 4 MG/ML IV SOLN
3.0000 mg | Freq: Once | INTRAVENOUS | Status: AC
  Administered 2023-12-27: 3 mg via INTRAVENOUS
  Filled 2023-12-27: qty 1

## 2023-12-27 MED ORDER — LOSARTAN POTASSIUM 25 MG PO TABS
25.0000 mg | ORAL_TABLET | Freq: Every day | ORAL | Status: DC
  Administered 2023-12-27: 25 mg via ORAL
  Filled 2023-12-27: qty 1

## 2023-12-27 MED ORDER — CLOPIDOGREL BISULFATE 75 MG PO TABS
75.0000 mg | ORAL_TABLET | Freq: Every day | ORAL | Status: DC
  Administered 2023-12-29: 75 mg via ORAL
  Filled 2023-12-27: qty 1

## 2023-12-27 MED ORDER — TECHNETIUM TC 99M MEBROFENIN IV KIT
5.3000 | PACK | Freq: Once | INTRAVENOUS | Status: AC | PRN
  Administered 2023-12-27: 5.3 via INTRAVENOUS
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
## 2023-12-27 NOTE — Progress Notes (Signed)
 [facility]- General Surgery  SURGICAL PROGRESS NOTE  Hospital Day(s): 2  Interval History:  Patient states he is doing well. He appears to be more alert and in a better mood this morning. States he is still asymptomatic, denies any abdominal pain. Denies any diarrhea, nausea or vomiting. He is tolerating regular diet. Labs are improving.   Vital signs in last 24 hours: [min-max] current  Temp:  [98.2 F (36.8 C)-99 F (37.2 C)] 98.9 F (37.2 C) (08/28 0704) Pulse Rate:  [71-81] 76 (08/28 0704) Resp:  [14-25] 22 (08/28 0704) BP: (101-147)/(55-84) 134/75 (08/28 0704) SpO2:  [91 %-99 %] 95 % (08/28 0704)       Weight: 106.6 kg     Intake/Output last 2 shifts:  08/27 0701 - 08/28 0700 In: 3212.8 [P.O.:480; I.V.:2575.3; IV Piggyback:157.6] Out: 1975 [Urine:1975]   Physical Exam:  Constitutional: alert, cooperative and no distress  Respiratory: breathing non-labored at rest  Cardiovascular: regular rate and sinus rhythm  Gastrointestinal: soft, non-tender, and non-distended  Labs:     Latest Ref Rng & Units 12/27/2023    4:46 AM 12/26/2023    4:47 AM 12/25/2023    8:45 PM  CBC  WBC 4.0 - 10.5 K/uL 14.5  27.2  15.5   Hemoglobin 13.0 - 17.0 g/dL 86.2  86.8  84.9   Hematocrit 39.0 - 52.0 % 40.4  37.3  42.6   Platelets 150 - 400 K/uL 222  217  239       Latest Ref Rng & Units 12/27/2023    4:46 AM 12/26/2023    4:47 AM 12/25/2023    8:45 PM  CMP  Glucose 70 - 99 mg/dL 895  870  879   BUN 8 - 23 mg/dL 14  22  22    Creatinine 0.61 - 1.24 mg/dL 9.32  9.03  9.22   Sodium 135 - 145 mmol/L 135  130  132   Potassium 3.5 - 5.1 mmol/L 3.4  3.6  3.1   Chloride 98 - 111 mmol/L 106  101  99   CO2 22 - 32 mmol/L 21  22  22    Calcium  8.9 - 10.3 mg/dL 8.4  8.2  9.2   Total Protein 6.5 - 8.1 g/dL 6.6  6.2  7.5   Total Bilirubin 0.0 - 1.2 mg/dL 1.7  1.3  1.6   Alkaline Phos 38 - 126 U/L 164  184  239   AST 15 - 41 U/L 141  309  576   ALT 0 - 44 U/L 244  349  468     Imaging studies:  No new pertinent imaging studies   Assessment/Plan:  83 y.o. male with cholelithiasis, complicated by pertinent comorbidities including CAD, essential hypertension, hyperlipidemia, bilateral carotid artery stenosis, and bilateral leg edema.    - Stable vital signs, no fever not tachycardic  - Leukocytosis improving 27.2 >>14.5 - Blood culture detected enterobacterales and E-coli, UA was negative. Although patient remains asymptomatic and labs are improving, plan for HIDA scan this afternoon due to positive blood cultures - Placed NPO for HIDA scan  - Continue IV Zosyn    -- Nikolina Simerson Barrientos PA-C
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
## 2023-12-27 NOTE — Plan of Care (Signed)
  Problem: Health Behavior/Discharge Planning: Goal: Ability to manage health-related needs will improve Outcome: Progressing   Problem: Clinical Measurements: Goal: Will remain free from infection Outcome: Progressing   Problem: Clinical Measurements: Goal: Diagnostic test results will improve Outcome: Progressing   

## 2023-12-27 NOTE — Consult Note (Addendum)
 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  Secure with silicone foam.    Patient should continue follow-up with [facility] and follow their care instructions for wound at home.   POC discussed with bedside nurse. WOC team will not follow. Re-consult if further needs arise.   Thank you,    Powell Bar MSN, RN-BC, [employer]

## 2023-12-27 NOTE — Plan of Care (Signed)
  Problem: Clinical Measurements: Goal: Ability to maintain clinical measurements within normal limits will improve Outcome: Progressing   Problem: Clinical Measurements: Goal: Respiratory complications will improve Outcome: Progressing   Problem: Clinical Measurements: Goal: Cardiovascular complication will be avoided Outcome: Progressing   Problem: Pain Managment: Goal: General experience of comfort will improve and/or be controlled Outcome: Progressing   Problem: Safety: Goal: Ability to remain free from injury will improve Outcome: Progressing

## 2023-12-27 NOTE — TOC Benefit Eligibility Note (Signed)
 Pharmacy Patient Advocate Encounter  Insurance verification completed.    The patient is insured through [employer]. Patient has Medicare and is not eligible for a copay card, but may be able to apply for patient assistance or Medicare RX Payment Plan (Patient Must reach out to their plan, if eligible for payment plan), if available.    Ran test claim for Warfarin 5mg   and the current 30 day co-pay is $1.62.   This test claim was processed through [facility]- copay amounts may vary at other pharmacies due to pharmacy/plan contracts, or as the patient moves through the different stages of their insurance plan.

## 2023-12-27 NOTE — Progress Notes (Signed)
 [facility] Progress Note  Patient: Shawn Hart    FMW:[id number]  DOA: 12/25/2023     Date of Service: the patient was seen and examined on 12/27/2023  Chief Complaint  Patient presents with   Altered Mental Status   Brief hospital course: TYSE AURIEMMA is a 83 y.o. male with medical history significant for osteoarthritis, bilateral carotid artery stenosis, CHF, coronary artery disease, GERD, gout, hypertension and dyslipidemia, presented to the emergency room with acute onset o generalized weakness and altered mental status with confusion.  The patient has been having dry cough with associated wheezing and dyspnea.  No urinary frequency or urgency.  No chest pain or palpitations.  No nausea or vomiting or diarrhea or abdominal pain.  No bleeding diathesis.   ED Course: When the patient came to the ER, temperature was 99.8 and heart rate 118 with respiratory rate of 27 and otherwise normal vital signs.  Heart rate has been as high as 148.  Labs revealed unremarkable UA and negative respiratory panel.  CMP was remarkable for mild hyponatremia and hypokalemia with elevated alk phos of 229, AST 576 and ALT 468 with total bili 1.6.  CBC showed leukocytosis 15.5 with neutrophilia.  INR is 1.1 and PT is 14.5 with Tylenol  level less than 10 and blood glucose 120. EKG as reviewed by me :  EKG showed sinus tachycardia with rate of 117 with PACs, left posterior fascicular block and low voltage QRS and Q waves inferiorly. Imaging: Portable chest x-ray showed no acute cardiopulmonary disease. Noncontrast head CT scan revealed no acute intracranial normalities.  It showed chronic atrophic changes. Right upper quadrant ultrasound revealed cholelithiasis and hepatic steatosis.   The patient was given 20 mg of IV Cardizem , 1 L bolus of IV normal saline, 40 mg of p.o. potassium chloride  as well as IV Zosyn .  I started him on IV amiodarone  with bolus and drip given borderline blood pressure.  He will  be admitted to the progressive unit observation bed for further evaluation and management.    Assessment and Plan:  # Paroxysmal A-fib w/RVR, resolved, currently SR 8/27 converted back to sinus rhythm - s/p IV amiodarone  drip, transition to oral amiodarone  400 twice daily for 7 days followed by 200 mg p.o. daily Started heparin  IV infusion, eventually patient will need to DOAC. TTE LVEF 45 to 50%, no WMA, Cardiology consult appreciated Continue to monitor on telemetry    # Acute metabolic encephalopathy: Resolved - Will follow neurochecks every 4 hours for 24 hours. -She has no focal symptoms or signs. 8/27 patient is back to his baseline  # Hypokalemia: Potassium repleted Magnesium  level 2.0 Monitor electrolytes daily and replete as needed    # Elevated LFTs - Right upper ultrasound showed cholelithiasis without cholecystitis. - Will follow LFTs with hydration. - Will hold off statin therapy. 8/28 LFTs trending down    # Cholelithiasis - Given associated leukocytosis I will keep the patient on IV Zosyn . - follow blood cultures, growing E. coli, sensitivity report pending UA was negative as well as portable chest x-ray. - General Surgery consulted - Dr. Tye following, recommended to hold Plavix  for now for possible lap chole 8/28 recommended HIDA scan, report is pending    # Dyslipidemia - We will hold off statin therapy given elevated LFTs.   # Dementia with behavioral disturbance (HCC) - Will continue Aricept.   # Peripheral neuropathy - Will continue Lyrica .   # BPH (benign prostatic hyperplasia) - Will continue Flomax .   #  Essential hypertension - Held antihypertensive therapy due to soft BP Monitor BP and titrate medications accordingly 8/27 started Lopressor  25 mg p.o. twice daily with holding parameters Started losartan  50 mg p.o. daily starting on 8/29 as per cards   # Hypophosphatemia due to nutritional deficiency Phosphorus repleted.  Monitor  electrolytes daily.   Body mass index is 32.78 kg/m.  Interventions:   Diet: Heart healthy diet DVT Prophylaxis: Heparin  IV infusion  Advance goals of care discussion: Full code  Family Communication: family was not present at bedside, at the time of interview.  The pt provided permission to discuss medical plan with the family. Opportunity was given to ask question and all questions were answered satisfactorily.   Disposition:  Pt is from Home, admitted with sepsis, A fib rvr, still on IV Abx and Hep gtt, which precludes a safe discharge. Discharge to Home, when stable, and cleared by cards and general surgery.  Subjective: No significant events overnight.  Patient denied any chest pain or palpitation, no abdominal pain. Patient is back to his baseline.   Physical Exam: General: NAD, lying comfortably Appear in no distress, affect appropriate Eyes: PERRLA ENT: Oral Mucosa Clear, moist  Neck: no JVD,  Cardiovascular: S1 and S2 Present, no Murmur,  Respiratory: good respiratory effort, Bilateral Air entry equal and Decreased, no Crackles, no wheezes Abdomen: BS present, Soft, obese and no tenderness,  Skin: no rashes Extremities: no Pedal edema, no calf tenderness Neurologic: without any new focal findings Gait not checked due to patient safety concerns  Vitals:   12/26/23 2358 12/27/23 0428 12/27/23 0704 12/27/23 1132  BP: (!) 101/59 (!) 113/55 134/75 129/71  Pulse: 73 78 76 66  Resp: 20 20 (!) 22 20  Temp: 99 F (37.2 C) 98.6 F (37 C) 98.9 F (37.2 C) 98.2 F (36.8 C)  TempSrc:   Oral Oral  SpO2: 93% 91% 95% 94%  Weight:        Intake/Output Summary (Last 24 hours) at 12/27/2023 1707 Last data filed at 12/27/2023 0654 Gross per 24 hour  Intake 3212.84 ml  Output 1500 ml  Net 1712.84 ml   Filed Weights   12/25/23 2040  Weight: 106.6 kg    Data Reviewed: I have personally reviewed and interpreted daily labs, tele strips, imagings as discussed above. I  reviewed all nursing notes, pharmacy notes, vitals, pertinent old records I have discussed plan of care as described above with RN and patient/family.  CBC: Recent Labs  Lab 12/25/23 2045 12/26/23 0447 12/27/23 0446  WBC 15.5* 27.2* 14.5*  NEUTROABS 14.1*  --   --   HGB 15.0 13.1 13.7  HCT 42.6 37.3* 40.4  MCV 86.1 86.3 88.2  PLT 239 217 222   Basic Metabolic Panel: Recent Labs  Lab 12/25/23 2045 12/26/23 0447 12/27/23 0446  NA 132* 130* 135  K 3.1* 3.6 3.4*  CL 99 101 106  CO2 22 22 21*  GLUCOSE 120* 129* 104*  BUN 22 22 14   CREATININE 0.77 0.96 0.67  CALCIUM  9.2 8.2* 8.4*  MG 1.8 1.6* 2.0  PHOS  --  3.6 2.0*    Studies: No results found.   Scheduled Meds:  amiodarone   400 mg Oral BID   Followed by   NOREEN ON 01/05/2024] amiodarone   200 mg Oral Daily   ascorbic acid   500 mg Oral Daily   brimonidine   1 drop Right Eye BID   [START ON 12/28/2023] clopidogrel   75 mg Oral Daily   cyanocobalamin   1,000 mcg Oral Daily   dorzolamide -timolol   1 drop Both Eyes BID   loratadine   10 mg Oral QODAY   [START ON 12/28/2023] losartan   50 mg Oral Daily   metoprolol  tartrate  25 mg Oral BID    morphine  injection  3 mg Intravenous Once   multivitamin with minerals  1 tablet Oral Daily   pantoprazole   40 mg Oral Daily   pregabalin   50 mg Oral TID   Continuous Infusions:  heparin  1,600 Units/hr (12/27/23 0533)   piperacillin -tazobactam 3.375 g (12/27/23 0519)   potassium PHOSPHATE  IVPB (in mmol)     PRN Meds: acetaminophen  **OR** acetaminophen , magnesium  hydroxide, methocarbamol , ondansetron  **OR** ondansetron  (ZOFRAN ) IV, traZODone   Time spent: 40 minutes  Author: ELVAN SOR. MD [facility] 12/27/2023 5:07 PM  To reach On-call, see care teams to locate the attending and reach out to them via [email or web address]. If 7PM-7AM, please contact night-coverage If you still have difficulty reaching the attending provider, please page the [facility] (Director on Call) for [facility] on amion for assistance.

## 2023-12-28 ENCOUNTER — Encounter
Admission: EM | Disposition: A | Discharge status: Home Health | Payer: Self-pay | Source: Home / Self Care | Attending: Student

## 2023-12-28 ENCOUNTER — Inpatient Hospital Stay: Admitting: Radiology

## 2023-12-28 DIAGNOSIS — I4891 Unspecified atrial fibrillation: Secondary | ICD-10-CM | POA: Diagnosis not present

## 2023-12-28 HISTORY — PX: IR PERC CHOLECYSTOSTOMY: IMG2326

## 2023-12-28 LAB — CBC
HCT: 38.5 % — ABNORMAL LOW (ref 39.0–52.0)
Hemoglobin: 13.2 g/dL (ref 13.0–17.0)
MCH: 29.7 pg (ref 26.0–34.0)
MCHC: 34.3 g/dL (ref 30.0–36.0)
MCV: 86.7 fL (ref 80.0–100.0)
Platelets: 197 K/uL (ref 150–400)
RBC: 4.44 MIL/uL (ref 4.22–5.81)
RDW: 13.2 % (ref 11.5–15.5)
WBC: 11.3 K/uL — ABNORMAL HIGH (ref 4.0–10.5)
nRBC: 0 % (ref 0.0–0.2)

## 2023-12-28 LAB — BASIC METABOLIC PANEL WITH GFR
Anion gap: 7 (ref 5–15)
BUN: 12 mg/dL (ref 8–23)
CO2: 23 mmol/L (ref 22–32)
Calcium: 8.2 mg/dL — ABNORMAL LOW (ref 8.9–10.3)
Chloride: 102 mmol/L (ref 98–111)
Creatinine, Ser: 0.84 mg/dL (ref 0.61–1.24)
GFR, Estimated: >60 mL/min (ref >60)
Glucose, Bld: 107 mg/dL — ABNORMAL HIGH (ref 70–99)
Potassium: 3.6 mmol/L (ref 3.5–5.1)
Sodium: 132 mmol/L — ABNORMAL LOW (ref 135–145)

## 2023-12-28 LAB — HEPARIN LEVEL (UNFRACTIONATED): Heparin Unfractionated: 0.15 [IU]/mL — ABNORMAL LOW (ref 0.30–0.70)

## 2023-12-28 LAB — HEPATIC FUNCTION PANEL
ALT: 152 U/L — ABNORMAL HIGH (ref 0–44)
AST: 64 U/L — ABNORMAL HIGH (ref 15–41)
Albumin: 2.9 g/dL — ABNORMAL LOW (ref 3.5–5.0)
Alkaline Phosphatase: 152 U/L — ABNORMAL HIGH (ref 38–126)
Bilirubin, Direct: 0.3 mg/dL — ABNORMAL HIGH (ref 0.0–0.2)
Indirect Bilirubin: 1 mg/dL — ABNORMAL HIGH (ref 0.3–0.9)
Total Bilirubin: 1.3 mg/dL — ABNORMAL HIGH (ref 0.0–1.2)
Total Protein: 6.2 g/dL — ABNORMAL LOW (ref 6.5–8.1)

## 2023-12-28 LAB — CULTURE, BLOOD (ROUTINE X 2): Special Requests: ADEQUATE

## 2023-12-28 LAB — MAGNESIUM: Magnesium: 1.9 mg/dL (ref 1.7–2.4)

## 2023-12-28 LAB — PHOSPHORUS: Phosphorus: 2.6 mg/dL (ref 2.5–4.6)

## 2023-12-28 SURGERY — CHOLECYSTECTOMY, ROBOT-ASSISTED, LAPAROSCOPIC
Anesthesia: General

## 2023-12-28 MED ORDER — HYDROMORPHONE HCL 1 MG/ML IJ SOLN
INTRAMUSCULAR | Status: AC
  Filled 2023-12-28: qty 0.5

## 2023-12-28 MED ORDER — MIDAZOLAM HCL 2 MG/2ML IJ SOLN
INTRAMUSCULAR | Status: AC
  Filled 2023-12-28: qty 2

## 2023-12-28 MED ORDER — NALOXONE HCL 0.4 MG/ML IJ SOLN: 0.4000 mg | INTRAMUSCULAR | Status: DC | PRN

## 2023-12-28 MED ORDER — MIDAZOLAM HCL 2 MG/2ML IJ SOLN
INTRAMUSCULAR | Status: AC | PRN
  Administered 2023-12-28: 1 mg via INTRAVENOUS

## 2023-12-28 MED ORDER — IOHEXOL 300 MG/ML  SOLN
50.0000 mL | Freq: Once | INTRAMUSCULAR | Status: AC | PRN
  Administered 2023-12-28: 15 mL

## 2023-12-28 MED ORDER — HYDROMORPHONE HCL 1 MG/ML IJ SOLN: 1.0000 mg | Freq: Once | INTRAMUSCULAR | Status: DC

## 2023-12-28 MED ORDER — LIDOCAINE HCL 1 % IJ SOLN
20.0000 mL | Freq: Once | INTRAMUSCULAR | Status: AC
  Administered 2023-12-28: 20 mL via INTRADERMAL
  Filled 2023-12-28: qty 20

## 2023-12-28 MED ORDER — FENTANYL CITRATE (PF) 100 MCG/2ML IJ SOLN
INTRAMUSCULAR | Status: AC
  Filled 2023-12-28: qty 2

## 2023-12-28 MED ORDER — MORPHINE SULFATE (PF) 2 MG/ML IV SOLN: 2.0000 mg | INTRAVENOUS | Status: DC | PRN

## 2023-12-28 MED ORDER — SODIUM CHLORIDE 0.9 % IV SOLN
2.0000 g | INTRAVENOUS | Status: AC
  Administered 2023-12-28: 2 g via INTRAVENOUS
  Filled 2023-12-28 (×2): qty 2

## 2023-12-28 MED ORDER — HEPARIN (PORCINE) 25000 UT/250ML-% IV SOLN
2400.0000 [IU]/h | INTRAVENOUS | Status: DC
  Administered 2023-12-28: 1900 [IU]/h via INTRAVENOUS
  Administered 2023-12-29: 2100 [IU]/h via INTRAVENOUS
  Administered 2023-12-30: 2400 [IU]/h via INTRAVENOUS
  Filled 2023-12-28 (×4): qty 250

## 2023-12-28 MED ORDER — FENTANYL CITRATE (PF) 100 MCG/2ML IJ SOLN
INTRAMUSCULAR | Status: AC | PRN
  Administered 2023-12-28: 25 ug via INTRAVENOUS
  Administered 2023-12-28: 50 ug via INTRAVENOUS
  Administered 2023-12-28: 25 ug via INTRAVENOUS

## 2023-12-28 MED ORDER — HEPARIN BOLUS VIA INFUSION
3000.0000 [IU] | Freq: Once | INTRAVENOUS | Status: AC
  Administered 2023-12-28: 3000 [IU] via INTRAVENOUS
  Filled 2023-12-28: qty 3000

## 2023-12-28 MED ORDER — SODIUM CHLORIDE 0.9 % IV SOLN
INTRAVENOUS | Status: AC | PRN
  Administered 2023-12-28: 2 g via INTRAVENOUS

## 2023-12-28 MED ORDER — OXYCODONE HCL 5 MG PO TABS
5.0000 mg | ORAL_TABLET | Freq: Four times a day (QID) | ORAL | Status: DC | PRN
  Administered 2023-12-28: 5 mg via ORAL
  Filled 2023-12-28: qty 1

## 2023-12-28 MED ORDER — ACETAMINOPHEN 325 MG PO TABS: 650.0000 mg | ORAL_TABLET | Freq: Four times a day (QID) | ORAL | Status: DC | PRN

## 2023-12-28 MED ORDER — SODIUM CHLORIDE 0.9% FLUSH
5.0000 mL | Freq: Three times a day (TID) | INTRAVENOUS | Status: DC
  Administered 2023-12-28 – 2023-12-31 (×9): 5 mL

## 2023-12-28 MED ORDER — LIDOCAINE HCL 1 % IJ SOLN
INTRAMUSCULAR | Status: AC
  Filled 2023-12-28: qty 20

## 2023-12-28 NOTE — Progress Notes (Signed)
 [facility]- General Surgery  SURGICAL PROGRESS NOTE  Hospital Day(s): 2.   Interval History:  Patient is doing well. He is still asymptomatic with no abdominal pain. Labs continue to trend down. However, gallbladder was not visualized with HIDA scan yesterday, consistent with acute cholecystitis.    Vital signs in last 24 hours: [min-max] current  Temp:  [97.9 F (36.6 C)-98.4 F (36.9 C)] 97.9 F (36.6 C) (08/29 0713) Pulse Rate:  [66-83] 73 (08/29 0713) Resp:  [16-22] 22 (08/29 0713) BP: (121-130)/(69-83) 121/78 (08/29 0713) SpO2:  [92 %-95 %] 92 % (08/29 0512)       Weight: 106.6 kg     Intake/Output last 2 shifts:  08/28 0701 - 08/29 0700 In: 475.1 [P.O.:120; I.V.:236.4; IV Piggyback:118.7] Out: -    Physical Exam:  Constitutional: alert, cooperative and no distress  Respiratory: breathing non-labored at rest  Cardiovascular: regular rate and sinus rhythm  Gastrointestinal: soft, non-tender, and non-distended  Labs:     Latest Ref Rng & Units 12/28/2023    5:41 AM 12/27/2023    4:46 AM 12/26/2023    4:47 AM  CBC  WBC 4.0 - 10.5 K/uL 11.3  14.5  27.2   Hemoglobin 13.0 - 17.0 g/dL 86.7  86.2  86.8   Hematocrit 39.0 - 52.0 % 38.5  40.4  37.3   Platelets 150 - 400 K/uL 197  222  217       Latest Ref Rng & Units 12/28/2023    5:41 AM 12/27/2023    4:46 AM 12/26/2023    4:47 AM  CMP  Glucose 70 - 99 mg/dL 892  895  870   BUN 8 - 23 mg/dL 12  14  22    Creatinine 0.61 - 1.24 mg/dL 9.15  9.32  9.03   Sodium 135 - 145 mmol/L 132  135  130   Potassium 3.5 - 5.1 mmol/L 3.6  3.4  3.6   Chloride 98 - 111 mmol/L 102  106  101   CO2 22 - 32 mmol/L 23  21  22    Calcium  8.9 - 10.3 mg/dL 8.2  8.4  8.2   Total Protein 6.5 - 8.1 g/dL 6.2  6.6  6.2   Total Bilirubin 0.0 - 1.2 mg/dL 1.3  1.7  1.3   Alkaline Phos 38 - 126 U/L 152  164  184   AST 15 - 41 U/L 64  141  309   ALT 0 - 44 U/L 152  244  349     Imaging studies:  CLINICAL DATA:  Positive blood cultures for E coli.   Cholelithiasis.   EXAM: NUCLEAR MEDICINE HEPATOBILIARY IMAGING   TECHNIQUE: Sequential images of the abdomen were obtained out to 60 minutes following intravenous administration of radiopharmaceutical.   RADIOPHARMACEUTICALS:  5.3 mCi Tc-76m  Choletec  IV   COMPARISON:  Ultrasound abdomen 12/25/2023   FINDINGS: There is prompt and homogeneous uptake of activity throughout the liver with progressive washout on delayed imaging. Prompt appearance of activity in the bile ducts and small bowel suggesting no evidence of common duct obstruction. No activity is demonstrated in the gallbladder on early or delayed imaging. Nonvisualization of the gallbladder is consistent with acute cholecystitis.   IMPRESSION: 1. Nonvisualization of the gallbladder consistent with acute cholecystitis. 2. No evidence of bile duct obstruction.     Electronically Signed   By: Elsie Gravely M.D.   On: 12/27/2023 17:29   Assessment/Plan:  83 y.o. male with acute cholecystitis and cholelithiasis,  complicated by pertinent comorbidities including CAD, essential hypertension, hyperlipidemia, bilateral carotid artery stenosis, and bilateral leg edema.    - No fever and not tachycardic, stable vital signs    - Leukocytosis continues to improve 14.5 >>11.3   - HIDA scan confirmed acute cholecystitis- discussed treatment options including cholecystectomy and cholecystostomy drain. Patient opted for cholecystostomy drain and would like to schedule surgery electively at a later time. Discussed with IR and they are planning to place drain this afternoon   - Continue NPO for procedure   - Continue IV Zosyn     -- Khriz Liddy Barrientos PA-C

## 2023-12-28 NOTE — Plan of Care (Signed)
  Problem: Education: Goal: Knowledge of General Education information will improve Description: Including pain rating scale, medication(s)/side effects and non-pharmacologic comfort measures 12/28/2023 1530 by Gwenn Ronal Niemann, RN Outcome: Progressing 12/28/2023 1530 by Gwenn Ronal Niemann, RN Outcome: Progressing   Problem: Health Behavior/Discharge Planning: Goal: Ability to manage health-related needs will improve 12/28/2023 1530 by Gwenn Ronal Niemann, RN Outcome: Progressing 12/28/2023 1530 by Gwenn Ronal Niemann, RN Outcome: Progressing   Problem: Clinical Measurements: Goal: Ability to maintain clinical measurements within normal limits will improve 12/28/2023 1530 by Gwenn Ronal Niemann, RN Outcome: Progressing 12/28/2023 1530 by Gwenn Ronal Niemann, RN Outcome: Progressing Goal: Will remain free from infection 12/28/2023 1530 by Gwenn Ronal Niemann, RN Outcome: Progressing 12/28/2023 1530 by Gwenn Ronal Niemann, RN Outcome: Progressing Goal: Diagnostic test results will improve 12/28/2023 1530 by Gwenn Ronal Niemann, RN Outcome: Progressing 12/28/2023 1530 by Gwenn Ronal Niemann, RN Outcome: Progressing Goal: Respiratory complications will improve 12/28/2023 1530 by Gwenn Ronal Niemann, RN Outcome: Progressing 12/28/2023 1530 by Gwenn Ronal Niemann, RN Outcome: Progressing Goal: Cardiovascular complication will be avoided Outcome: Progressing   Problem: Activity: Goal: Risk for activity intolerance will decrease Outcome: Progressing   Problem: Nutrition: Goal: Adequate nutrition will be maintained Outcome: Progressing   Problem: Coping: Goal: Level of anxiety will decrease Outcome: Progressing   Problem: Elimination: Goal: Will not experience complications related to bowel motility Outcome: Progressing Goal: Will not experience complications related to urinary retention Outcome: Progressing   Problem: Pain Managment: Goal: General  experience of comfort will improve and/or be controlled Outcome: Progressing   Problem: Safety: Goal: Ability to remain free from injury will improve Outcome: Progressing   Problem: Skin Integrity: Goal: Risk for impaired skin integrity will decrease Outcome: Progressing

## 2023-12-28 NOTE — Progress Notes (Signed)
 Patient clinically stable post IR Chole tube placement, upon insertion of tube placement began having increasing pain to 10/10, attempted fentanyl  dose, however ineffective, spoke with Dr Hughes, given Dilaudid  for 10/10 abdominal discomfort, report given to Ruthanna Kitty RN, will continue to monitor for pain control. With prn pain meds n/c oxygen placed to 2l/min.
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
## 2023-12-28 NOTE — Progress Notes (Signed)
 PA came and assessed patient and advised taking him back to his room. Will inform surgical team to put pain management orders in.

## 2023-12-28 NOTE — Progress Notes (Signed)
 [facility] Progress Note  Patient: Shawn Hart    FMW:[id number]  DOA: 12/25/2023     Date of Service: the patient was seen and examined on 12/28/2023  Chief Complaint  Patient presents with   Altered Mental Status   Brief hospital course: Shawn Hart is a 83 y.o. male with medical history significant for osteoarthritis, bilateral carotid artery stenosis, CHF, coronary artery disease, GERD, gout, hypertension and dyslipidemia, presented to the emergency room with acute onset o generalized weakness and altered mental status with confusion.  The patient has been having dry cough with associated wheezing and dyspnea.  No urinary frequency or urgency.  No chest pain or palpitations.  No nausea or vomiting or diarrhea or abdominal pain.  No bleeding diathesis.   ED Course: When the patient came to the ER, temperature was 99.8 and heart rate 118 with respiratory rate of 27 and otherwise normal vital signs.  Heart rate has been as high as 148.  Labs revealed unremarkable UA and negative respiratory panel.  CMP was remarkable for mild hyponatremia and hypokalemia with elevated alk phos of 229, AST 576 and ALT 468 with total bili 1.6.  CBC showed leukocytosis 15.5 with neutrophilia.  INR is 1.1 and PT is 14.5 with Tylenol  level less than 10 and blood glucose 120. EKG as reviewed by me :  EKG showed sinus tachycardia with rate of 117 with PACs, left posterior fascicular block and low voltage QRS and Q waves inferiorly. Imaging: Portable chest x-ray showed no acute cardiopulmonary disease. Noncontrast head CT scan revealed no acute intracranial normalities.  It showed chronic atrophic changes. Right upper quadrant ultrasound revealed cholelithiasis and hepatic steatosis.   The patient was given 20 mg of IV Cardizem , 1 L bolus of IV normal saline, 40 mg of p.o. potassium chloride  as well as IV Zosyn .  I started him on IV amiodarone  with bolus and drip given borderline blood pressure.  He will  be admitted to the progressive unit observation bed for further evaluation and management.    Assessment and Plan:  # Paroxysmal A-fib w/RVR, resolved, currently SR 8/27 converted back to sinus rhythm - s/p IV amiodarone  drip, transition to oral amiodarone  400 twice daily for 7 days followed by 200 mg p.o. daily Started heparin  IV infusion, eventually patient will need to DOAC. TTE LVEF 45 to 50%, no WMA, Cardiology consult appreciated Continue to monitor on telemetry  # Acute cholecystitis with cholelithiasis # Sepsis due to acute cholecystitis and transaminitis. # E. coli bacteremia  - Right upper ultrasound showed cholelithiasis without cholecystitis. --Continue Zosyn  - Will hold off statin therapy. - General Surgery consulted - Dr. Tye following, recommended to hold Plavix  for now for possible lap chole 8/28 recommended HIDA scan, report is pending 8/29 HIDA scan positive for cholecystectomy, patient would like to get elective lap chole done as an outpatient. IR consulted for percutaneous cholecystostomy tube insertion    # Acute metabolic encephalopathy: Resolved - Will follow neurochecks every 4 hours for 24 hours. -She has no focal symptoms or signs. 8/27 patient is back to his baseline  # Hypokalemia: Potassium repleted Magnesium  level 2.0 Monitor electrolytes daily and replete as needed    # Dyslipidemia: Lipid profile stable, LDL 48, below goal <70 - Hold off statin therapy given elevated LFTs.   # Dementia with behavioral disturbance (HCC) - continue Aricept.   # Peripheral neuropathy - continue Lyrica .   # BPH (benign prostatic hyperplasia) - continue Flomax .   #  Essential hypertension - Held antihypertensive therapy due to soft BP Monitor BP and titrate medications accordingly 8/27 started Lopressor  25 mg p.o. twice daily with holding parameters Started losartan  50 mg p.o. daily starting on 8/29 as per cards   # Hypophosphatemia due to  nutritional deficiency Phosphorus repleted.  Monitor electrolytes daily.   Body mass index is 32.78 kg/m.  Interventions:   Diet: Heart healthy diet, NPO since MN for Sx DVT Prophylaxis: Heparin  IV infusion  Advance goals of care discussion: Full code  Family Communication: family was not present at bedside, at the time of interview.  The pt provided permission to discuss medical plan with the family. Opportunity was given to ask question and all questions were answered satisfactorily.   Disposition:  Pt is from Home, admitted with sepsis, A fib rvr, still on IV Abx and Hep gtt, found to have acute cholecystitis, percutaneous cholecystostomy tube insertion scheduled by IR on 8/29, which precludes a safe discharge. Discharge to Home, when stable, and cleared by cards and general surgery.  Subjective: No significant events overnight. Patient denied any chest pain or palpitation, no abdominal pain. Patient agreed for percutaneous cholecystostomy tube insertion.   Physical Exam: General: NAD, lying comfortably Appear in no distress, affect appropriate Eyes: PERRLA ENT: Oral Mucosa Clear, moist  Neck: no JVD,  Cardiovascular: S1 and S2 Present, no Murmur,  Respiratory: good respiratory effort, Bilateral Air entry equal and Decreased, no Crackles, no wheezes Abdomen: BS present, Soft, obese and no tenderness,  Skin: no rashes Extremities: no Pedal edema, no calf tenderness Neurologic: without any new focal findings Gait not checked due to patient safety concerns  Vitals:   12/28/23 1455 12/28/23 1509 12/28/23 1515 12/28/23 1530  BP: (!) 147/89 (!) 151/89 (!) 147/91 (!) 152/86  Pulse: 75 79 71 76  Resp: 16 (!) 23 16 (!) 31  Temp:  97.9 F (36.6 C)    TempSrc:  Temporal    SpO2: 92% 91% 91% 92%  Weight:      Height:        Intake/Output Summary (Last 24 hours) at 12/28/2023 1531 Last data filed at 12/27/2023 1924 Gross per 24 hour  Intake 475.14 ml  Output --  Net  475.14 ml   Filed Weights   12/25/23 2040 12/28/23 1345  Weight: 106.6 kg 106.6 kg    Data Reviewed: I have personally reviewed and interpreted daily labs, tele strips, imagings as discussed above. I reviewed all nursing notes, pharmacy notes, vitals, pertinent old records I have discussed plan of care as described above with RN and patient/family.  CBC: Recent Labs  Lab 12/25/23 2045 12/26/23 0447 12/27/23 0446 12/28/23 0541  WBC 15.5* 27.2* 14.5* 11.3*  NEUTROABS 14.1*  --   --   --   HGB 15.0 13.1 13.7 13.2  HCT 42.6 37.3* 40.4 38.5*  MCV 86.1 86.3 88.2 86.7  PLT 239 217 222 197   Basic Metabolic Panel: Recent Labs  Lab 12/25/23 2045 12/26/23 0447 12/27/23 0446 12/28/23 0541  NA 132* 130* 135 132*  K 3.1* 3.6 3.4* 3.6  CL 99 101 106 102  CO2 22 22 21* 23  GLUCOSE 120* 129* 104* 107*  BUN 22 22 14 12   CREATININE 0.77 0.96 0.67 0.84  CALCIUM  9.2 8.2* 8.4* 8.2*  MG 1.8 1.6* 2.0 1.9  PHOS  --  3.6 2.0* 2.6    Studies: NM Hepatobiliary Liver Func Result Date: 12/27/2023 CLINICAL DATA:  Positive blood cultures for E coli.  Cholelithiasis.  EXAM: NUCLEAR MEDICINE HEPATOBILIARY IMAGING TECHNIQUE: Sequential images of the abdomen were obtained out to 60 minutes following intravenous administration of radiopharmaceutical. RADIOPHARMACEUTICALS:  5.3 mCi Tc-12m  Choletec  IV COMPARISON:  Ultrasound abdomen 12/25/2023 FINDINGS: There is prompt and homogeneous uptake of activity throughout the liver with progressive washout on delayed imaging. Prompt appearance of activity in the bile ducts and small bowel suggesting no evidence of common duct obstruction. No activity is demonstrated in the gallbladder on early or delayed imaging. Nonvisualization of the gallbladder is consistent with acute cholecystitis. IMPRESSION: 1. Nonvisualization of the gallbladder consistent with acute cholecystitis. 2. No evidence of bile duct obstruction. Electronically Signed   By: Elsie Gravely M.D.    On: 12/27/2023 17:29     Scheduled Meds:  amiodarone   400 mg Oral BID   Followed by   NOREEN ON 01/05/2024] amiodarone   200 mg Oral Daily   ascorbic acid   500 mg Oral Daily   brimonidine   1 drop Right Eye BID   [START ON 12/29/2023] clopidogrel   75 mg Oral Daily   cyanocobalamin   1,000 mcg Oral Daily   dorzolamide -timolol   1 drop Both Eyes BID   HYDROmorphone         HYDROmorphone  (DILAUDID ) injection  1 mg Intravenous Once   loratadine   10 mg Oral QODAY   losartan   50 mg Oral Daily   metoprolol  tartrate  25 mg Oral BID   multivitamin with minerals  1 tablet Oral Daily   pantoprazole   40 mg Oral Daily   pregabalin   50 mg Oral TID   sodium chloride  flush  5 mL Intracatheter Q8H   Continuous Infusions:  cefOXitin      heparin  Stopped (12/28/23 1258)   piperacillin -tazobactam 3.375 g (12/28/23 0516)   PRN Meds: acetaminophen  **OR** acetaminophen , HYDROmorphone , magnesium  hydroxide, methocarbamol , ondansetron  **OR** ondansetron  (ZOFRAN ) IV, traZODone   Time spent: 40 minutes  Author: ELVAN SOR. MD [facility] 12/28/2023 3:31 PM  To reach On-call, see care teams to locate the attending and reach out to them via [email or web address]. If 7PM-7AM, please contact night-coverage If you still have difficulty reaching the attending provider, please page the [facility] (Director on Call) for [facility] on amion for assistance.

## 2023-12-28 NOTE — Procedures (Signed)
 Vascular and Interventional Radiology Procedure Note  Patient: Shawn Hart Apcpwd DOB: 02/06/1941 Medical Record Number: [id number] Note Date/Time: 12/28/23 3:06 PM   Performing Physician: Thom Hall, MD Assistant(s): None  Diagnosis: Acute cholecystitis. +NM HIDA  Procedure:  CHOLECYSTOSTOMY TUBE PLACEMENT ANTEROGRADE CHOLANGIOGRAM  Anesthesia: Conscious Sedation Complications: None Estimated Blood Loss: Minimal Specimens:  None  Findings:  Successful placement of 46F cholecystostomy tube.  Plan: Flush tube w 5 mL sterile NS q8h and record drain output qShift. Follow up for routine tube evaluation in 6-8 week(s).   See detailed procedure note with images in PACS. The patient tolerated the procedure well without incident or complication and was returned to Recovery in stable condition.    Thom Hall, MD [facility] Interventional Radiology [facility] Radiology   Pager. [phone] Clinic. [phone]

## 2023-12-28 NOTE — TOC CM/SW Note (Signed)
 Transition of Care [facility]) - Inpatient Brief Assessment   Patient Details  Name: Shawn Hart MRN: [id number] Date of Birth: 1940/11/21  [facility] CM/SW Contact:    Lauraine JAYSON Carpen, LCSW Phone Number: 12/28/2023, 3:30 PM   Clinical Narrative: CSW reviewed chart. No TOC needs identified so far. CSW will continue to follow patient for support and facilitate return home once stable.  Transition of Care Asessment: Insurance and Status: Insurance coverage has been reviewed Patient has primary care physician: Yes Home environment has been reviewed: Single family home Prior level of function:: Not documented Prior/Current Home Services: No current home services Social Drivers of Health Review: SDOH reviewed no interventions necessary Readmission risk has been reviewed: Yes Transition of care needs: no transition of care needs at this time

## 2023-12-28 NOTE — Care Management Important Message (Signed)
 Important Message  Patient Details  Name: Shawn Hart MRN: [id number] Date of Birth: 1940-08-14   Important Message Given:  yes      [name] 12/28/2023, 4:10 PM
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
## 2023-12-29 ENCOUNTER — Other Ambulatory Visit (HOSPITAL_COMMUNITY): Payer: Self-pay

## 2023-12-29 DIAGNOSIS — K810 Acute cholecystitis: Secondary | ICD-10-CM | POA: Diagnosis not present

## 2023-12-29 DIAGNOSIS — I4891 Unspecified atrial fibrillation: Secondary | ICD-10-CM | POA: Diagnosis not present

## 2023-12-29 LAB — HEPATIC FUNCTION PANEL
ALT: 112 U/L — ABNORMAL HIGH (ref 0–44)
AST: 40 U/L (ref 15–41)
Albumin: 3 g/dL — ABNORMAL LOW (ref 3.5–5.0)
Alkaline Phosphatase: 143 U/L — ABNORMAL HIGH (ref 38–126)
Bilirubin, Direct: 0.3 mg/dL — ABNORMAL HIGH (ref 0.0–0.2)
Indirect Bilirubin: 1 mg/dL — ABNORMAL HIGH (ref 0.3–0.9)
Total Bilirubin: 1.3 mg/dL — ABNORMAL HIGH (ref 0.0–1.2)
Total Protein: 6.7 g/dL (ref 6.5–8.1)

## 2023-12-29 LAB — CBC
HCT: 39.3 % (ref 39.0–52.0)
Hemoglobin: 13.7 g/dL (ref 13.0–17.0)
MCH: 29.9 pg (ref 26.0–34.0)
MCHC: 34.9 g/dL (ref 30.0–36.0)
MCV: 85.8 fL (ref 80.0–100.0)
Platelets: 208 K/uL (ref 150–400)
RBC: 4.58 MIL/uL (ref 4.22–5.81)
RDW: 13.1 % (ref 11.5–15.5)
WBC: 14.8 K/uL — ABNORMAL HIGH (ref 4.0–10.5)
nRBC: 0 % (ref 0.0–0.2)

## 2023-12-29 LAB — BASIC METABOLIC PANEL WITH GFR
Anion gap: 9 (ref 5–15)
BUN: 13 mg/dL (ref 8–23)
CO2: 20 mmol/L — ABNORMAL LOW (ref 22–32)
Calcium: 8.4 mg/dL — ABNORMAL LOW (ref 8.9–10.3)
Chloride: 102 mmol/L (ref 98–111)
Creatinine, Ser: 0.73 mg/dL (ref 0.61–1.24)
GFR, Estimated: >60 mL/min (ref >60)
Glucose, Bld: 109 mg/dL — ABNORMAL HIGH (ref 70–99)
Potassium: 3.4 mmol/L — ABNORMAL LOW (ref 3.5–5.1)
Sodium: 131 mmol/L — ABNORMAL LOW (ref 135–145)

## 2023-12-29 LAB — HEPARIN LEVEL (UNFRACTIONATED)
Heparin Unfractionated: 0.24 [IU]/mL — ABNORMAL LOW (ref 0.30–0.70)
Heparin Unfractionated: 0.4 [IU]/mL (ref 0.30–0.70)
Heparin Unfractionated: 0.34 [IU]/mL (ref 0.30–0.70)

## 2023-12-29 LAB — PHOSPHORUS: Phosphorus: 2.6 mg/dL (ref 2.5–4.6)

## 2023-12-29 LAB — OSMOLALITY: Osmolality: 271 mosm/kg — ABNORMAL LOW (ref 275–295)

## 2023-12-29 LAB — MAGNESIUM: Magnesium: 1.9 mg/dL (ref 1.7–2.4)

## 2023-12-29 MED ORDER — CLOPIDOGREL BISULFATE 75 MG PO TABS: 75.0000 mg | ORAL_TABLET | Freq: Every day | ORAL | Status: DC

## 2023-12-29 MED ORDER — SODIUM BICARBONATE 650 MG PO TABS
650.0000 mg | ORAL_TABLET | Freq: Three times a day (TID) | ORAL | Status: AC
  Administered 2023-12-29 (×2): 650 mg via ORAL
  Filled 2023-12-29 (×2): qty 1

## 2023-12-29 MED ORDER — SODIUM CHLORIDE 0.9% FLUSH: 5.0000 mL | Freq: Every day | INTRAVENOUS | 1 refills | Status: AC

## 2023-12-29 MED ORDER — POTASSIUM CHLORIDE CRYS ER 20 MEQ PO TBCR
40.0000 meq | EXTENDED_RELEASE_TABLET | Freq: Once | ORAL | Status: AC
  Administered 2023-12-29: 40 meq via ORAL
  Filled 2023-12-29: qty 2

## 2023-12-29 MED ORDER — APIXABAN 5 MG PO TABS
5.0000 mg | ORAL_TABLET | Freq: Two times a day (BID) | ORAL | 0 refills | Status: DC
  Filled 2023-12-29: qty 60, 30d supply, fill #0

## 2023-12-29 MED ORDER — CLOPIDOGREL BISULFATE 75 MG PO TABS
75.0000 mg | ORAL_TABLET | Freq: Every day | ORAL | Status: DC
  Administered 2023-12-30 – 2023-12-31 (×2): 75 mg via ORAL
  Filled 2023-12-29 (×2): qty 1

## 2023-12-29 MED ORDER — HEPARIN BOLUS VIA INFUSION
1500.0000 [IU] | Freq: Once | INTRAVENOUS | Status: AC
  Administered 2023-12-29: 1500 [IU] via INTRAVENOUS
  Filled 2023-12-29: qty 1500
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
## 2023-12-29 NOTE — TOC Progression Note (Signed)
 Transition of Care [facility]) - Progression Note    Patient Details  Name: BONIFACE GOFFE MRN: [id number] Date of Birth: 1940-12-11  Transition of Care [facility]) CM/SW Contact  [place] L Deronda Christian, [place] Phone Number: 12/29/2023, 5:01 PM  Clinical Narrative:     CSW met with patient. Daughter was at bedside. CSW discussed discharge planning and the need for home health. CSW advised that according to [facility], patient received HHA services previously from [facility]. Patient was agreeable to [facility] receiving the referral.   CSW contacted [facility] to set up RN services for patient. [facility] advised that she will speak to intake and advise CSW if they could accept patient. Patient is not scheduled for discharge until tomorrow.                     Expected Discharge Plan and Services                                               Social Drivers of Health (SDOH) Interventions SDOH Screenings   Food Insecurity: No Food Insecurity (12/26/2023)  Housing: Low Risk  (12/26/2023)  Transportation Needs: No Transportation Needs (12/26/2023)  Utilities: Not At Risk (12/26/2023)  Financial Resource Strain: Low Risk  (11/20/2023)   Received from [facility]  Social Connections: Moderately Integrated (12/26/2023)  Tobacco Use: Low Risk  (12/25/2023)    Readmission Risk Interventions     No data to display

## 2023-12-29 NOTE — Plan of Care (Signed)
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
## 2023-12-29 NOTE — Plan of Care (Signed)

## 2023-12-29 NOTE — Progress Notes (Signed)
 [facility] Progress Note  Patient: Shawn Hart    FMW:[id number]  DOA: 12/25/2023     Date of Service: the patient was seen and examined on 12/29/2023  Chief Complaint  Patient presents with   Altered Mental Status   Brief hospital course: Shawn Hart is a 83 y.o. male with medical history significant for osteoarthritis, bilateral carotid artery stenosis, CHF, coronary artery disease, GERD, gout, hypertension and dyslipidemia, presented to the emergency room with acute onset o generalized weakness and altered mental status with confusion.  The patient has been having dry cough with associated wheezing and dyspnea.  No urinary frequency or urgency.  No chest pain or palpitations.  No nausea or vomiting or diarrhea or abdominal pain.  No bleeding diathesis.   ED Course: When the patient came to the ER, temperature was 99.8 and heart rate 118 with respiratory rate of 27 and otherwise normal vital signs.  Heart rate has been as high as 148.  Labs revealed unremarkable UA and negative respiratory panel.  CMP was remarkable for mild hyponatremia and hypokalemia with elevated alk phos of 229, AST 576 and ALT 468 with total bili 1.6.  CBC showed leukocytosis 15.5 with neutrophilia.  INR is 1.1 and PT is 14.5 with Tylenol  level less than 10 and blood glucose 120. EKG as reviewed by me :  EKG showed sinus tachycardia with rate of 117 with PACs, left posterior fascicular block and low voltage QRS and Q waves inferiorly. Imaging: Portable chest x-ray showed no acute cardiopulmonary disease. Noncontrast head CT scan revealed no acute intracranial normalities.  It showed chronic atrophic changes. Right upper quadrant ultrasound revealed cholelithiasis and hepatic steatosis.   The patient was given 20 mg of IV Cardizem , 1 L bolus of IV normal saline, 40 mg of p.o. potassium chloride  as well as IV Zosyn .  I started him on IV amiodarone  with bolus and drip given borderline blood pressure.  He will  be admitted to the progressive unit observation bed for further evaluation and management.    Assessment and Plan:  # Paroxysmal A-fib w/RVR, resolved, currently SR 8/27 converted back to sinus rhythm - s/p IV amiodarone  drip, transition to oral amiodarone  400 twice daily for 7 days followed by 200 mg p.o. daily TTE LVEF 45 to 50%, no WMA, Cardiology consult appreciated Continue to monitor on telemetry 8/30 continue heparin  IV infusion, will transition to DOAC tomorrow a.m.  # Acute cholecystitis with cholelithiasis # Sepsis due to acute cholecystitis and transaminitis. # E. coli bacteremia  - Right upper ultrasound showed cholelithiasis without cholecystitis. --Continue Zosyn  - Will hold off statin therapy. - General Surgery consulted - Dr. Tye following, recommended to hold Plavix  for now for possible lap chole 8/28 recommended HIDA scan, report is pending 8/29 HIDA scan positive for cholecystectomy, patient would like to get elective lap chole done as an outpatient. S/p percutaneous cholecystostomy tube insertion done by IR 8/30 drain need to be flushed every other day, RN/home health will be arranged by TOC.  Most likely we will discharge patient tomorrow a.m.    # Acute metabolic encephalopathy: Resolved -She has no focal symptoms or signs. 8/27 patient is back to his baseline  # Hypokalemia: Potassium repleted Magnesium  level 2.0 Monitor electrolytes daily and replete as needed    # Dyslipidemia: Lipid profile stable, LDL 48, below goal <70 - Hold off statin therapy given elevated LFTs.   # Dementia with behavioral disturbance (HCC) - continue Aricept.   # Peripheral  neuropathy - continue Lyrica .   # BPH (benign prostatic hyperplasia) - continue Flomax .   # Essential hypertension - Held antihypertensive therapy due to soft BP Monitor BP and titrate medications accordingly 8/27 started Lopressor  25 mg p.o. twice daily with holding parameters Started  losartan  50 mg p.o. daily starting on 8/29 as per cards   # Hypophosphatemia due to nutritional deficiency Phosphorus repleted.  Monitor electrolytes daily.  # h/o PAD, s/p b/l carotid stent, resumed Plavix  on 8/30 Resume statin when LFTs normalize  Body mass index is 32.78 kg/m.  Interventions:   Diet: Heart healthy diet DVT Prophylaxis: Heparin  IV infusion  Advance goals of care discussion: Full code  Family Communication: family was not present at bedside, at the time of interview.  The pt provided permission to discuss medical plan with the family. Opportunity was given to ask question and all questions were answered satisfactorily.  8/30 d/w patient's daughter at bedside  Disposition:  Pt is from Home, admitted with sepsis, A fib rvr, still on IV Abx and Hep gtt, found to have acute cholecystitis, s/p perc cholecystostomy tube insertion done by IR on 8/29, which precludes a safe discharge. Discharge to Home, when stable, most likely tomorrow a.m.  Need to arrange home health RN and will switch to DOAC tomorrow a.m.  Subjective: No significant events overnight. Patient denied any chest pain or palpitation, no abdominal pain.   Physical Exam: General: NAD, lying comfortably Appear in no distress, affect appropriate Eyes: PERRLA ENT: Oral Mucosa Clear, moist  Neck: no JVD,  Cardiovascular: S1 and S2 Present, no Murmur,  Respiratory: good respiratory effort, Bilateral Air entry equal and Decreased, no Crackles, no wheezes Abdomen: BS present, Soft, obese and no tenderness, s/p Perc cholecystostomy tube insertion, bag attached to draining clear bile Skin: no rashes Extremities: no Pedal edema, no calf tenderness Neurologic: without any new focal findings Gait not checked due to patient safety concerns  Vitals:   12/29/23 0454 12/29/23 0819 12/29/23 0907 12/29/23 1220  BP: 134/75 125/68 125/68 120/63  Pulse: 79 81 81 62  Resp: 18 20  17   Temp: 98.1 F (36.7 C) (!)  97.5 F (36.4 C)  97.9 F (36.6 C)  TempSrc:  Oral  Oral  SpO2: 95% 94%  97%  Weight:      Height:        Intake/Output Summary (Last 24 hours) at 12/29/2023 1452 Last data filed at 12/29/2023 1313 Gross per 24 hour  Intake 1167.91 ml  Output 425 ml  Net 742.91 ml   Filed Weights   12/25/23 2040 12/28/23 1345  Weight: 106.6 kg 106.6 kg    Data Reviewed: I have personally reviewed and interpreted daily labs, tele strips, imagings as discussed above. I reviewed all nursing notes, pharmacy notes, vitals, pertinent old records I have discussed plan of care as described above with RN and patient/family.  CBC: Recent Labs  Lab 12/25/23 2045 12/26/23 0447 12/27/23 0446 12/28/23 0541 12/29/23 0507  WBC 15.5* 27.2* 14.5* 11.3* 14.8*  NEUTROABS 14.1*  --   --   --   --   HGB 15.0 13.1 13.7 13.2 13.7  HCT 42.6 37.3* 40.4 38.5* 39.3  MCV 86.1 86.3 88.2 86.7 85.8  PLT 239 217 222 197 208   Basic Metabolic Panel: Recent Labs  Lab 12/25/23 2045 12/26/23 0447 12/27/23 0446 12/28/23 0541 12/29/23 0507  NA 132* 130* 135 132* 131*  K 3.1* 3.6 3.4* 3.6 3.4*  CL 99 101 106 102 102  CO2 22 22 21* 23 20*  GLUCOSE 120* 129* 104* 107* 109*  BUN 22 22 14 12 13   CREATININE 0.77 0.96 0.67 0.84 0.73  CALCIUM  9.2 8.2* 8.4* 8.2* 8.4*  MG 1.8 1.6* 2.0 1.9 1.9  PHOS  --  3.6 2.0* 2.6 2.6    Studies: IR Perc Cholecystostomy Result Date: 12/28/2023 INDICATION: Acute cholecystitis. EXAM: ULTRASOUND AND FLUOROSCOPIC-GUIDED CHOLECYSTOSTOMY TUBE PLACEMENT COMPARISON:  NM HIDA, 12/19/2023.  US  Abdomen, 12/25/2023. MEDICATIONS: Cefoxitin  2 g IV antibiotics were administered within an appropriate time frame prior to skin puncture. ANESTHESIA/SEDATION: Moderate (conscious) sedation was employed during this procedure. A total of Versed  1 mg and Fentanyl  100 mcg was administered intravenously. Moderate Sedation Time: 12 minutes. The patient's level of consciousness and vital signs were monitored  continuously by radiology nursing throughout the procedure under my direct supervision. CONTRAST:  15mL OMNIPAQUE  IOHEXOL  300 MG/ML SOLN - administered into the gallbladder fossa. FLUOROSCOPY: Radiation Exposure Index and estimated peak skin dose (PSD); Reference air kerma (RAK), 12.3 mGy. COMPLICATIONS: None immediate. PROCEDURE: Informed written consent was obtained from the patient after a discussion of the risks, benefits and alternatives to treatment. Questions regarding the procedure were encouraged and answered. A timeout was performed prior to the initiation of the procedure. The RIGHT upper abdominal quadrant was prepped and draped in the usual sterile fashion, and a sterile drape was applied covering the operative field. Maximum barrier sterile technique with sterile gowns and gloves were used for the procedure. A timeout was performed prior to the initiation of the procedure. Local anesthesia was provided with 1% lidocaine  with epinephrine . Ultrasound scanning of the RIGHT upper quadrant demonstrates a distended gallbladder. Of note, the patient reported pain with ultrasound imaging over the gallbladder. Utilizing a transhepatic approach, a 22 gauge needle was advanced into the gallbladder under direct ultrasound guidance. An ultrasound image was saved for documentation purposes. Appropriate intraluminal puncture was confirmed with the efflux of bile and advancement of an 0.018 wire into the gallbladder lumen. The needle was exchanged for an Accustick set. A small amount of contrast was injected to confirm appropriate intraluminal positioning. Over a Benson wire, a 10 Fr cholecystomy tube was advanced into the gallbladder fossa, coiled and locked. Bile was aspirated and a small amount of contrast was injected as several post procedural spot radiographic images were obtained in various obliquities. The catheter was secured to the skin with suture, connected to a drainage bag and a dressing was placed. The  patient tolerated the procedure well without immediate post procedural complication. IMPRESSION: Successful ultrasound and fluoroscopic guided placement of a 10 Fr cholecystostomy tube. RECOMMENDATIONS: The patient will return to Vascular Interventional Radiology (VIR) for routine drainage catheter evaluation and exchange in 6-8 weeks. Thom Hall, MD [facility] Electronically Signed   By: Thom Hall M.D.   On: 12/28/2023 16:57     Scheduled Meds:  amiodarone   400 mg Oral BID   Followed by   NOREEN ON 01/05/2024] amiodarone   200 mg Oral Daily   ascorbic acid   500 mg Oral Daily   brimonidine   1 drop Right Eye BID   [START ON 01/05/2024] clopidogrel   75 mg Oral Daily   cyanocobalamin   1,000 mcg Oral Daily   dorzolamide -timolol   1 drop Both Eyes BID    HYDROmorphone  (DILAUDID ) injection  1 mg Intravenous Once   loratadine   10 mg Oral QODAY   losartan   50 mg Oral Daily   metoprolol  tartrate  25 mg  Oral BID   multivitamin with minerals  1 tablet Oral Daily   pantoprazole   40 mg Oral Daily   pregabalin   50 mg Oral TID   sodium bicarbonate   650 mg Oral TID   sodium chloride  flush  5 mL Intracatheter Q8H   Continuous Infusions:  heparin  2,100 Units/hr (12/29/23 0920)   piperacillin -tazobactam 3.375 g (12/29/23 1416)   PRN Meds: acetaminophen  **OR** [DISCONTINUED] acetaminophen , magnesium  hydroxide, methocarbamol , morphine  injection, naLOXone  (NARCAN )  injection, ondansetron  **OR** ondansetron  (ZOFRAN ) IV, oxyCODONE , traZODone   Time spent: 55 minutes  Author: ELVAN SOR. MD [facility] 12/29/2023 2:52 PM  To reach On-call, see care teams to locate the attending and reach out to them via [email or web address]. If 7PM-7AM, please contact night-coverage If you still have difficulty reaching the attending provider, please page the [facility] (Director on Call) for [facility] on amion for assistance.

## 2023-12-29 NOTE — Progress Notes (Addendum)
 12/29/2023  Subjective: No acute events overnight.  Patient reports that he is feeling well and denies any right upper quadrant abdominal pain.  He has been tolerating a cardiac diet.  Drain had a high output of 300 mL yesterday.  Cultures currently pending with no organisms seen but is too young to read.  Vital signs: Temp:  [97.5 F (36.4 C)-98.5 F (36.9 C)] 97.5 F (36.4 C) (08/30 0819) Pulse Rate:  [63-96] 81 (08/30 0907) Resp:  [14-31] 20 (08/30 0819) BP: (125-152)/(68-91) 125/68 (08/30 0907) SpO2:  [90 %-99 %] 94 % (08/30 0819) Weight:  [106.6 kg] 106.6 kg (08/29 1345)   Intake/Output: 08/29 0701 - 08/30 0700 In: 437.9 [I.V.:180.2; IV Piggyback:247.7] Out: 300 [Drains:300] Last BM Date : 12/28/23  Physical Exam: Constitutional: No acute distress Abdomen: Soft, nondistended, with some soreness to palpation at the drain insertion site itself but not really over the region of the right upper quadrant.  The drain is in place with bile within the bag.  No blood or purulence noted  Labs:  Recent Labs    12/28/23 0541 12/29/23 0507  WBC 11.3* 14.8*  HGB 13.2 13.7  HCT 38.5* 39.3  PLT 197 208   Recent Labs    12/28/23 0541 12/29/23 0507  NA 132* 131*  K 3.6 3.4*  CL 102 102  CO2 23 20*  GLUCOSE 107* 109*  BUN 12 13  CREATININE 0.84 0.73  CALCIUM  8.2* 8.4*   No results for input(s): LABPROT, INR in the last 72 hours.  Imaging: IR Perc Cholecystostomy Result Date: 12/28/2023 INDICATION: Acute cholecystitis. EXAM: ULTRASOUND AND FLUOROSCOPIC-GUIDED CHOLECYSTOSTOMY TUBE PLACEMENT COMPARISON:  NM HIDA, 12/19/2023.  US  Abdomen, 12/25/2023. MEDICATIONS: Cefoxitin  2 g IV antibiotics were administered within an appropriate time frame prior to skin puncture. ANESTHESIA/SEDATION: Moderate (conscious) sedation was employed during this procedure. A total of Versed  1 mg and Fentanyl  100 mcg was administered intravenously. Moderate Sedation Time: 12 minutes. The patient's  level of consciousness and vital signs were monitored continuously by radiology nursing throughout the procedure under my direct supervision. CONTRAST:  15mL OMNIPAQUE  IOHEXOL  300 MG/ML SOLN - administered into the gallbladder fossa. FLUOROSCOPY: Radiation Exposure Index and estimated peak skin dose (PSD); Reference air kerma (RAK), 12.3 mGy. COMPLICATIONS: None immediate. PROCEDURE: Informed written consent was obtained from the patient after a discussion of the risks, benefits and alternatives to treatment. Questions regarding the procedure were encouraged and answered. A timeout was performed prior to the initiation of the procedure. The RIGHT upper abdominal quadrant was prepped and draped in the usual sterile fashion, and a sterile drape was applied covering the operative field. Maximum barrier sterile technique with sterile gowns and gloves were used for the procedure. A timeout was performed prior to the initiation of the procedure. Local anesthesia was provided with 1% lidocaine  with epinephrine . Ultrasound scanning of the RIGHT upper quadrant demonstrates a distended gallbladder. Of note, the patient reported pain with ultrasound imaging over the gallbladder. Utilizing a transhepatic approach, a 22 gauge needle was advanced into the gallbladder under direct ultrasound guidance. An ultrasound image was saved for documentation purposes. Appropriate intraluminal puncture was confirmed with the efflux of bile and advancement of an 0.018 wire into the gallbladder lumen. The needle was exchanged for an Accustick set. A small amount of contrast was injected to confirm appropriate intraluminal positioning. Over a Benson wire, a 10 Fr cholecystomy tube was advanced into the gallbladder fossa, coiled and locked. Bile was aspirated and a small amount of contrast  was injected as several post procedural spot radiographic images were obtained in various obliquities. The catheter was secured to the skin with suture,  connected to a drainage bag and a dressing was placed. The patient tolerated the procedure well without immediate post procedural complication. IMPRESSION: Successful ultrasound and fluoroscopic guided placement of a 10 Fr cholecystostomy tube. RECOMMENDATIONS: The patient will return to Vascular Interventional Radiology (VIR) for routine drainage catheter evaluation and exchange in 6-8 weeks. Thom Hall, MD [facility] Radiology Electronically Signed   By: Thom Hall M.D.   On: 12/28/2023 16:57    Assessment/Plan: This is a 83 y.o. male with acute cholecystitis.  - Patient has been doing well after his percutaneous cholecystostomy drain placement.  Culture still pending today.  Patient is tolerating a cardiac diet without issues. - Continue IV Zosyn  for his cholecystitis. - From surgical standpoint, his anticoagulation can be transition to oral anticoagulation. - Have asked the patient's RN to instruct him how to flush his drain.  The patient reports to me that he is trying to get home health that can come and flush the drain daily.  However I think it would still be important for him to know how to flush it just in case.  I will have a paper prescription for the saline flushes that he can get them for home. - Surgery team will sign off for now but were available if any questions or concerns. - Follow-up with Dr. Tye in 3 weeks.   I spent 35 minutes dedicated to the care of this patient on the date of this encounter to include pre-visit review of records, face-to-face time with the patient discussing diagnosis and management, and any post-visit coordination of care.  Aloysius Sheree Plant, MD [facility]

## 2023-12-29 NOTE — Discharge Instructions (Signed)
 Drain Instructions: --Please flush your drain with 5 ml NS once daily. --Please empty and record drain output twice daily and as needed.  Bring record of drain output with you to your office appointment with Dr. Tye. --Dressing:  Dry gauze dressing around drain, secure with tape.  Change once daily. --It is normal for there to be some days with higher output and some days with lower output --Please reach out to Dr. Estelita office if any issues with drain clogging -- difficulty flushing the drain, leakage around the drain, worsening abdominal pain.   Home Health services for nursing has been setup with [facility]. Start of Care will be 9/5 or sooner. Please ensure that you have a teachable relative/friend that can be given instruction on how to flush your drain.    Information on my medicine - ELIQUIS  (apixaban )  This medication education was reviewed with me or my healthcare representative as part of my discharge preparation.  The pharmacist that spoke with me during my hospital stay was:    Why was Eliquis  prescribed for you? Eliquis  was prescribed for you to reduce the risk of a blood clot forming that can cause a stroke if you have a medical condition called atrial fibrillation (a type of irregular heartbeat).  What do You need to know about Eliquis  ? Take your Eliquis  TWICE DAILY - one tablet in the morning and one tablet in the evening with or without food. If you have difficulty swallowing the tablet whole please discuss with your pharmacist how to take the medication safely.  Take Eliquis  exactly as prescribed by your doctor and DO NOT stop taking Eliquis  without talking to the doctor who prescribed the medication.  Stopping may increase your risk of developing a stroke.  Refill your prescription before you run out.  After discharge, you should have regular check-up appointments with your healthcare provider that is prescribing your Eliquis .  In the future your dose may need  to be changed if your kidney function or weight changes by a significant amount or as you get older.  What do you do if you miss a dose? If you miss a dose, take it as soon as you remember on the same day and resume taking twice daily.  Do not take more than one dose of ELIQUIS  at the same time to make up a missed dose.  Important Safety Information A possible side effect of Eliquis  is bleeding. You should call your healthcare provider right away if you experience any of the following: Bleeding from an injury or your nose that does not stop. Unusual colored urine (red or dark brown) or unusual colored stools (red or black). Unusual bruising for unknown reasons. A serious fall or if you hit your head (even if there is no bleeding).  Some medicines may interact with Eliquis  and might increase your risk of bleeding or clotting while on Eliquis . To help avoid this, consult your healthcare provider or pharmacist prior to using any new prescription or non-prescription medications, including herbals, vitamins, non-steroidal anti-inflammatory drugs (NSAIDs) and supplements.  This website has more information on Eliquis  (apixaban ): [email or web address] .com/eliquis dena

## 2023-12-30 DIAGNOSIS — I4891 Unspecified atrial fibrillation: Secondary | ICD-10-CM | POA: Diagnosis not present

## 2023-12-30 LAB — HEPATIC FUNCTION PANEL
ALT: 80 U/L — ABNORMAL HIGH (ref 0–44)
AST: 28 U/L (ref 15–41)
Albumin: 2.9 g/dL — ABNORMAL LOW (ref 3.5–5.0)
Alkaline Phosphatase: 119 U/L (ref 38–126)
Bilirubin, Direct: 0.2 mg/dL (ref 0.0–0.2)
Indirect Bilirubin: 0.6 mg/dL (ref 0.3–0.9)
Total Bilirubin: 0.8 mg/dL (ref 0.0–1.2)
Total Protein: 6.7 g/dL (ref 6.5–8.1)

## 2023-12-30 LAB — BASIC METABOLIC PANEL WITH GFR
Anion gap: 8 (ref 5–15)
BUN: 12 mg/dL (ref 8–23)
CO2: 22 mmol/L (ref 22–32)
Calcium: 8.3 mg/dL — ABNORMAL LOW (ref 8.9–10.3)
Chloride: 103 mmol/L (ref 98–111)
Creatinine, Ser: 0.5 mg/dL — ABNORMAL LOW (ref 0.61–1.24)
GFR, Estimated: >60 mL/min (ref >60)
Glucose, Bld: 114 mg/dL — ABNORMAL HIGH (ref 70–99)
Potassium: 3.4 mmol/L — ABNORMAL LOW (ref 3.5–5.1)
Sodium: 133 mmol/L — ABNORMAL LOW (ref 135–145)

## 2023-12-30 LAB — CULTURE, BLOOD (ROUTINE X 2)
Culture: NO GROWTH
Special Requests: ADEQUATE

## 2023-12-30 LAB — PHOSPHORUS: Phosphorus: 2.6 mg/dL (ref 2.5–4.6)

## 2023-12-30 LAB — CBC
HCT: 37.7 % — ABNORMAL LOW (ref 39.0–52.0)
Hemoglobin: 13 g/dL (ref 13.0–17.0)
MCH: 29.8 pg (ref 26.0–34.0)
MCHC: 34.5 g/dL (ref 30.0–36.0)
MCV: 86.5 fL (ref 80.0–100.0)
Platelets: 217 K/uL (ref 150–400)
RBC: 4.36 MIL/uL (ref 4.22–5.81)
RDW: 13.2 % (ref 11.5–15.5)
WBC: 11.8 K/uL — ABNORMAL HIGH (ref 4.0–10.5)
nRBC: 0 % (ref 0.0–0.2)

## 2023-12-30 LAB — HEPARIN LEVEL (UNFRACTIONATED)
Heparin Unfractionated: 0.29 [IU]/mL — ABNORMAL LOW (ref 0.30–0.70)
Heparin Unfractionated: >1.1 [IU]/mL — ABNORMAL HIGH (ref 0.30–0.70)

## 2023-12-30 LAB — MAGNESIUM: Magnesium: 2 mg/dL (ref 1.7–2.4)

## 2023-12-30 MED ORDER — APIXABAN 5 MG PO TABS
5.0000 mg | ORAL_TABLET | Freq: Two times a day (BID) | ORAL | Status: DC
  Administered 2023-12-30 – 2023-12-31 (×3): 5 mg via ORAL
  Filled 2023-12-30 (×4): qty 1

## 2023-12-30 MED ORDER — HEPARIN BOLUS VIA INFUSION
1500.0000 [IU] | Freq: Once | INTRAVENOUS | Status: AC
  Administered 2023-12-30: 1500 [IU] via INTRAVENOUS
  Filled 2023-12-30: qty 1500

## 2023-12-30 MED ORDER — POTASSIUM CHLORIDE CRYS ER 20 MEQ PO TBCR
40.0000 meq | EXTENDED_RELEASE_TABLET | Freq: Once | ORAL | Status: AC
  Administered 2023-12-30: 40 meq via ORAL
  Filled 2023-12-30: qty 2
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
## 2023-12-30 NOTE — TOC Progression Note (Signed)
 Transition of Care [facility]) - Progression Note    Patient Details  Name: Shawn Hart MRN: [id number] Date of Birth: 02/05/1941  Transition of [facility] CM/SW Contact  [place] L Corbet [place], [place] Phone Number: 12/30/2023, 8:36 AM  Clinical Narrative:     CSW received notification from [facility] [place] 9/5 for HHA services.                     Expected Discharge Plan and Services                                               Social Drivers of Health (SDOH) Interventions SDOH Screenings   Food Insecurity: No Food Insecurity (12/26/2023)  Housing: Low Risk  (12/26/2023)  Transportation Needs: No Transportation Needs (12/26/2023)  Utilities: Not At Risk (12/26/2023)  Financial Resource Strain: Low Risk  (11/20/2023)   Received from [facility]  Social Connections: Moderately Integrated (12/26/2023)  Tobacco Use: Low Risk  (12/25/2023)    Readmission Risk Interventions     No data to display

## 2023-12-30 NOTE — Plan of Care (Signed)

## 2023-12-30 NOTE — Progress Notes (Signed)
 [facility] Progress Note  Patient: Shawn Hart    FMW:[id number]  DOA: 12/25/2023     Date of Service: the patient was seen and examined on 12/30/2023  Chief Complaint  Patient presents with   Altered Mental Status   Brief hospital course: KYROLLOS CORDELL is a 83 y.o. male with medical history significant for osteoarthritis, bilateral carotid artery stenosis, CHF, coronary artery disease, GERD, gout, hypertension and dyslipidemia, presented to the emergency room with acute onset o generalized weakness and altered mental status with confusion.  The patient has been having dry cough with associated wheezing and dyspnea.  No urinary frequency or urgency.  No chest pain or palpitations.  No nausea or vomiting or diarrhea or abdominal pain.  No bleeding diathesis.   ED Course: When the patient came to the ER, temperature was 99.8 and heart rate 118 with respiratory rate of 27 and otherwise normal vital signs.  Heart rate has been as high as 148.  Labs revealed unremarkable UA and negative respiratory panel.  CMP was remarkable for mild hyponatremia and hypokalemia with elevated alk phos of 229, AST 576 and ALT 468 with total bili 1.6.  CBC showed leukocytosis 15.5 with neutrophilia.  INR is 1.1 and PT is 14.5 with Tylenol  level less than 10 and blood glucose 120. EKG as reviewed by me :  EKG showed sinus tachycardia with rate of 117 with PACs, left posterior fascicular block and low voltage QRS and Q waves inferiorly. Imaging: Portable chest x-ray showed no acute cardiopulmonary disease. Noncontrast head CT scan revealed no acute intracranial normalities.  It showed chronic atrophic changes. Right upper quadrant ultrasound revealed cholelithiasis and hepatic steatosis.   The patient was given 20 mg of IV Cardizem , 1 L bolus of IV normal saline, 40 mg of p.o. potassium chloride  as well as IV Zosyn .  I started him on IV amiodarone  with bolus and drip given borderline blood pressure.  He will  be admitted to the progressive unit observation bed for further evaluation and management.    Assessment and Plan:  # Paroxysmal A-fib w/RVR, resolved, currently SR 8/27 converted back to sinus rhythm - s/p IV amiodarone  drip, transition to oral amiodarone  400 twice daily for 7 days followed by 200 mg p.o. daily TTE LVEF 45 to 50%, no WMA, Cardiology consult appreciated Continue to monitor on telemetry 8/31 s/p heparin  IV gtt, transition to DOAC today  # Acute cholecystitis with cholelithiasis # Sepsis due to acute cholecystitis and transaminitis. # E. coli bacteremia  - Right upper ultrasound showed cholelithiasis without cholecystitis. --Continue Zosyn  - Will hold off statin therapy. - General Surgery consulted - Dr. Tye following, recommended to hold Plavix  for now for possible lap chole 8/28 recommended HIDA scan, report is pending 8/29 HIDA scan positive for cholecystectomy, patient would like to get elective lap chole done as an outpatient. S/p percutaneous cholecystostomy tube insertion done by IR 8/31 drain need to be flushed every other day, RN/home health will be arranged by TOC.  Most likely we will discharge patient tomorrow a.m. if home health arrangement will be done    # Acute metabolic encephalopathy: Resolved -She has no focal symptoms or signs. 8/27 patient is back to his baseline  # Hypokalemia: Potassium repleted Magnesium  level 2.0 Monitor electrolytes daily and replete as needed  # Hyponatremia, monitor sodium level to Serum osmolarity [id number]   # Dyslipidemia: Lipid profile stable, LDL 48, below goal <70 - Hold off statin therapy given elevated LFTs.   #  Dementia with behavioral disturbance (HCC) - continue Aricept.   # Peripheral neuropathy - continue Lyrica .   # BPH (benign prostatic hyperplasia) - continue Flomax .   # Essential hypertension - Held antihypertensive therapy due to soft BP Monitor BP and titrate medications  accordingly 8/27 started Lopressor  25 mg p.o. twice daily with holding parameters Started losartan  50 mg p.o. daily on 8/29 as per cards  # h/o CAD s/p stents, on plavix    # Hypophosphatemia due to nutritional deficiency Phosphorus repleted.  Monitor electrolytes daily.  # h/o PAD, s/p b/l carotid stent, resumed Plavix  on 8/30 Resume statin when LFTs normalize  Body mass index is 32.78 kg/m.  Interventions:   Diet: Heart healthy diet DVT Prophylaxis: Heparin  IV infusion  Advance goals of care discussion: Full code  Family Communication: family was not present at bedside, at the time of interview.  The pt provided permission to discuss medical plan with the family. Opportunity was given to ask question and all questions were answered satisfactorily.  8/30 d/w patient's daughter at bedside  Disposition:  Pt is from Home, admitted with sepsis, A fib rvr, still on IV Abx and Hep gtt, found to have acute cholecystitis, s/p perc cholecystostomy tube insertion done by IR on 8/29, which precludes a safe discharge. Discharge to Home, when stable, most likely tomorrow a.m.  Need to arrange home health RN and will switch to DOAC tomorrow a.m.  Subjective: No significant events overnight.  Patient was sitting comfortably on the recliner, denied any significant abdominal pain, no nausea vomiting.  Denied any chest pain or palpitations.   Physical Exam: General: NAD, lying comfortably Appear in no distress, affect appropriate Eyes: PERRLA ENT: Oral Mucosa Clear, moist  Neck: no JVD,  Cardiovascular: S1 and S2 Present, no Murmur,  Respiratory: good respiratory effort, Bilateral Air entry equal and Decreased, no Crackles, no wheezes Abdomen: BS present, Soft, obese and no tenderness, s/p Perc cholecystostomy tube insertion, bag attached to draining clear bile Skin: no rashes Extremities: no Pedal edema, no calf tenderness Neurologic: without any new focal findings Gait not checked due to  patient safety concerns  Vitals:   12/30/23 0011 12/30/23 0433 12/30/23 0847 12/30/23 0850  BP: 115/73 128/77 (!) 114/97 114/79  Pulse: 81 63 79 79  Resp: 20 20 17    Temp: 98.1 F (36.7 C) 97.6 F (36.4 C) 98 F (36.7 C)   TempSrc:   Oral   SpO2: 94% 96% 96%   Weight:      Height:        Intake/Output Summary (Last 24 hours) at 12/30/2023 1221 Last data filed at 12/30/2023 0900 Gross per 24 hour  Intake 1215 ml  Output 200 ml  Net 1015 ml   Filed Weights   12/25/23 2040 12/28/23 1345  Weight: 106.6 kg 106.6 kg    Data Reviewed: I have personally reviewed and interpreted daily labs, tele strips, imagings as discussed above. I reviewed all nursing notes, pharmacy notes, vitals, pertinent old records I have discussed plan of care as described above with RN and patient/family.  CBC: Recent Labs  Lab 12/25/23 2045 12/26/23 0447 12/27/23 0446 12/28/23 0541 12/29/23 0507 12/30/23 0430  WBC 15.5* 27.2* 14.5* 11.3* 14.8* 11.8*  NEUTROABS 14.1*  --   --   --   --   --   HGB 15.0 13.1 13.7 13.2 13.7 13.0  HCT 42.6 37.3* 40.4 38.5* 39.3 37.7*  MCV 86.1 86.3 88.2 86.7 85.8 86.5  PLT 239 217 222 197  208 217   Basic Metabolic Panel: Recent Labs  Lab 12/26/23 0447 12/27/23 0446 12/28/23 0541 12/29/23 0507 12/30/23 0430  NA 130* 135 132* 131* 133*  K 3.6 3.4* 3.6 3.4* 3.4*  CL 101 106 102 102 103  CO2 22 21* 23 20* 22  GLUCOSE 129* 104* 107* 109* 114*  BUN 22 14 12 13 12   CREATININE 0.96 0.67 0.84 0.73 0.50*  CALCIUM  8.2* 8.4* 8.2* 8.4* 8.3*  MG 1.6* 2.0 1.9 1.9 2.0  PHOS 3.6 2.0* 2.6 2.6 2.6    Studies: No results found.    Scheduled Meds:  amiodarone   400 mg Oral BID   Followed by   NOREEN ON 01/05/2024] amiodarone   200 mg Oral Daily   apixaban   5 mg Oral BID   ascorbic acid   500 mg Oral Daily   brimonidine   1 drop Right Eye BID   clopidogrel   75 mg Oral Daily   cyanocobalamin   1,000 mcg Oral Daily   dorzolamide -timolol   1 drop Both Eyes BID     HYDROmorphone  (DILAUDID ) injection  1 mg Intravenous Once   loratadine   10 mg Oral QODAY   losartan   50 mg Oral Daily   metoprolol  tartrate  25 mg Oral BID   multivitamin with minerals  1 tablet Oral Daily   pantoprazole   40 mg Oral Daily   pregabalin   50 mg Oral TID   sodium chloride  flush  5 mL Intracatheter Q8H   Continuous Infusions:  piperacillin -tazobactam 3.375 g (12/30/23 0600)   PRN Meds: acetaminophen  **OR** [DISCONTINUED] acetaminophen , magnesium  hydroxide, methocarbamol , morphine  injection, naLOXone  (NARCAN )  injection, ondansetron  **OR** ondansetron  (ZOFRAN ) IV, oxyCODONE , traZODone   Time spent: 55 minutes  Author: ELVAN SOR. MD [facility] 12/30/2023 12:21 PM  To reach On-call, see care teams to locate the attending and reach out to them via [email or web address]. If 7PM-7AM, please contact night-coverage If you still have difficulty reaching the attending provider, please page the [facility] (Director on Call) for [facility] on amion for assistance.

## 2023-12-31 DIAGNOSIS — I4891 Unspecified atrial fibrillation: Secondary | ICD-10-CM | POA: Diagnosis not present

## 2023-12-31 LAB — CBC
HCT: 40.1 % (ref 39.0–52.0)
Hemoglobin: 13.6 g/dL (ref 13.0–17.0)
MCH: 29.4 pg (ref 26.0–34.0)
MCHC: 33.9 g/dL (ref 30.0–36.0)
MCV: 86.6 fL (ref 80.0–100.0)
Platelets: 258 K/uL (ref 150–400)
RBC: 4.63 MIL/uL (ref 4.22–5.81)
RDW: 13.2 % (ref 11.5–15.5)
WBC: 10.7 K/uL — ABNORMAL HIGH (ref 4.0–10.5)
nRBC: 0 % (ref 0.0–0.2)

## 2023-12-31 LAB — BASIC METABOLIC PANEL WITH GFR
Anion gap: 8 (ref 5–15)
BUN: 14 mg/dL (ref 8–23)
CO2: 21 mmol/L — ABNORMAL LOW (ref 22–32)
Calcium: 8.8 mg/dL — ABNORMAL LOW (ref 8.9–10.3)
Chloride: 105 mmol/L (ref 98–111)
Creatinine, Ser: 0.81 mg/dL (ref 0.61–1.24)
GFR, Estimated: >60 mL/min (ref >60)
Glucose, Bld: 101 mg/dL — ABNORMAL HIGH (ref 70–99)
Potassium: 3.8 mmol/L (ref 3.5–5.1)
Sodium: 134 mmol/L — ABNORMAL LOW (ref 135–145)

## 2023-12-31 LAB — PHOSPHORUS: Phosphorus: 3.8 mg/dL (ref 2.5–4.6)

## 2023-12-31 LAB — HEPATIC FUNCTION PANEL
ALT: 68 U/L — ABNORMAL HIGH (ref 0–44)
AST: 28 U/L (ref 15–41)
Albumin: 3.1 g/dL — ABNORMAL LOW (ref 3.5–5.0)
Alkaline Phosphatase: 117 U/L (ref 38–126)
Bilirubin, Direct: 0.1 mg/dL (ref 0.0–0.2)
Indirect Bilirubin: 0.6 mg/dL (ref 0.3–0.9)
Total Bilirubin: 0.7 mg/dL (ref 0.0–1.2)
Total Protein: 6.7 g/dL (ref 6.5–8.1)

## 2023-12-31 LAB — MAGNESIUM: Magnesium: 2.1 mg/dL (ref 1.7–2.4)

## 2023-12-31 MED ORDER — METOPROLOL TARTRATE 25 MG PO TABS: 25.0000 mg | ORAL_TABLET | Freq: Two times a day (BID) | ORAL | 11 refills | Status: AC

## 2023-12-31 MED ORDER — AMIODARONE HCL 200 MG PO TABS: ORAL_TABLET | ORAL | 0 refills | Status: DC

## 2023-12-31 MED ORDER — SALINE SPRAY 0.65 % NA SOLN: 1.0000 | NASAL | Status: DC | PRN

## 2023-12-31 MED ORDER — ACETAMINOPHEN 500 MG PO TABS: 500.0000 mg | ORAL_TABLET | Freq: Four times a day (QID) | ORAL | Status: AC | PRN

## 2023-12-31 MED ORDER — AMOXICILLIN-POT CLAVULANATE 875-125 MG PO TABS: 1.0000 | ORAL_TABLET | Freq: Two times a day (BID) | ORAL | 0 refills | Status: AC

## 2023-12-31 MED ORDER — LOSARTAN POTASSIUM 50 MG PO TABS: 50.0000 mg | ORAL_TABLET | Freq: Every day | ORAL | 11 refills | Status: AC

## 2023-12-31 NOTE — Plan of Care (Signed)

## 2023-12-31 NOTE — Plan of Care (Signed)

## 2023-12-31 NOTE — Discharge Summary (Signed)
 [facility] Discharge Summary   Patient: Shawn Hart FMW:[id number]  PCP: Roxanna Rocks, PA  Date of admission: 12/25/2023   Date of discharge:  12/31/2023     Discharge Diagnoses:  Principal Problem:   Atrial fibrillation with RVR (HCC) Active Problems:   Acute encephalopathy   Elevated LFTs   Hypokalemia   Cholelithiasis   Essential hypertension   BPH (benign prostatic hyperplasia)   Peripheral neuropathy   Dementia with behavioral disturbance (HCC)   Dyslipidemia   Admitted From: Home Disposition:  Home   Recommendations for Outpatient Follow-up:  Follow-up with PCP in 1 week Follow-up with general surgery clinic for drain flushes dental home services start and continue to follow until lap chole will be scheduled.  Patient is on aspirin and Plavix  which needs to be held few days before the surgery.  Follow-up with general surgery for further recommendation Follow-up with cardiology in 1 to 2 weeks Follow up LABS/TEST:     Follow-up Information     Pendyal, Dearl, MD. Go in 1 week(s).   Specialty: Cardiology Contact information: [address] [place] [address] [phone]         Tye Millet, DO Follow up in 3 week(s).   Specialties: General Surgery, Surgery Contact information: [address] [place] [address] [phone]                Diet recommendation: Cardiac diet  Activity: The patient is advised to gradually reintroduce usual activities, as tolerated  Discharge Condition: stable  Code Status: Full code   History of present illness: As per the H and P dictated on admission. Hospital Course:  Shawn Hart is a 83 y.o. male with medical history significant for osteoarthritis, bilateral carotid artery stenosis, CHF, coronary artery disease, GERD, gout, hypertension and dyslipidemia, presented to the emergency room with acute onset o generalized weakness and altered mental status with confusion.  The patient has  been having dry cough with associated wheezing and dyspnea.  No urinary frequency or urgency.  No chest pain or palpitations.  No nausea or vomiting or diarrhea or abdominal pain.  No bleeding diathesis.   ED Course: When the patient came to the ER, temperature was 99.8 and heart rate 118 with respiratory rate of 27 and otherwise normal vital signs.  Heart rate has been as high as 148.  Labs revealed unremarkable UA and negative respiratory panel.  CMP was remarkable for mild hyponatremia and hypokalemia with elevated alk phos of 229, AST 576 and ALT 468 with total bili 1.6.  CBC showed leukocytosis 15.5 with neutrophilia.  INR is 1.1 and PT is 14.5 with Tylenol  level less than 10 and blood glucose 120. EKG as reviewed by me :  EKG showed sinus tachycardia with rate of 117 with PACs, left posterior fascicular block and low voltage QRS and Q waves inferiorly. Imaging: Portable chest x-ray showed no acute cardiopulmonary disease. Noncontrast head CT scan revealed no acute intracranial normalities.  It showed chronic atrophic changes. Right upper quadrant ultrasound revealed cholelithiasis and hepatic steatosis.   The patient was given 20 mg of IV Cardizem , 1 L bolus of IV normal saline, 40 mg of p.o. potassium chloride  as well as IV Zosyn .  I started him on IV amiodarone  with bolus and drip given borderline blood pressure.  He will be admitted to the progressive unit observation bed for further evaluation and management.      Assessment and Plan:   # Paroxysmal A-fib w/RVR, resolved,  currently SR 8/27 converted back to sinus rhythm - s/p IV amiodarone  drip, transition to oral amiodarone  400 twice daily for 7 days followed by 200 mg p.o. daily TTE LVEF 45 to 50%, no WMA. Cardiology consult appreciated 8/31 s/p heparin  IV gtt, transition to DOAC.  Cleared by cardiology to discharge and follow-up as an outpatient   # Acute cholecystitis with cholelithiasis # Sepsis due to acute cholecystitis and  transaminitis. # E. coli bacteremia  Right upper ultrasound showed cholelithiasis without cholecystitis. S/p Zosyn  IV given during hospital stay.  Held statin due to elevated LFTs during hospital stay.  Resumed Crestor  on discharge, LFTs within normal range.  General Surgery consulted Dr. Tye following, recommended to hold Plavix  for now for possible lap chole 8/28 recommended HIDA scan 8/29 HIDA scan positive for cholecystectomy, patient would like to get elective lap chole done as an outpatient. S/p percutaneous cholecystostomy tube insertion done by IR 9/1 seen by general surgery,  drain flush daily in our office until Home health can start seeing him on the 9/5. ok to be d/c'd from surgery standpoint.  Patient seems to be stable to discharge, agreed with discharge planning     # Acute metabolic encephalopathy: Resolved -She has no focal symptoms or signs. 8/27 patient is back to his baseline   # Hypokalemia: Potassium repleted.  Resolved Magnesium  level 2.0  # Hyponatremia, Na 134 remained stable Serum osmolarity 277>>271.  Continue fluid restriction 1.5 L/day   # Dyslipidemia: Lipid profile stable, LDL 48, below goal <70 Crestor  held due to elevated LFTs.  Resumed on discharge, LFTs normalized # Dementia with behavioral disturbance: continue Aricept. # Peripheral neuropathy: continue Lyrica . # BPH (benign prostatic hyperplasia): continue Flomax . # Essential hypertension: Held antihypertensive therapy due to soft BP 8/27 started Lopressor  25 mg p.o. twice daily with holding parameters Started losartan  50 mg p.o. daily on 8/29 as per cards BP stable, patient was advised to continue to monitor at home and follow with PCP  # h/o CAD s/p stents, on plavix  and statin  # Hypophosphatemia due to nutritional deficiency Phosphorus repleted.  Resolved # h/o PAD, s/p b/l carotid stent, resumed Plavix  on 8/30 Resume statin on discharge as LFTs normalized  Body mass index is 30.96  kg/m.  Nutrition Interventions:  - Patient was instructed, not to drive, operate heavy machinery, perform activities at heights, swimming or participation in water activities or provide baby sitting services while on Pain, Sleep and Anxiety Medications; until his outpatient Physician has advised to do so again.  - Also recommended to not to take more than prescribed Pain, Sleep and Anxiety Medications.  Patient was seen by physical therapy, who recommended Home health, which was arranged. On the day of the discharge the patient's vitals were stable, and no other acute medical condition were reported by patient. the patient was felt safe to be discharge at Home with Home health.  Consultants: Cardiology, general surgery and IR Procedures: s/p percutaneous cholecystostomy tube insertion  Discharge Exam: General: Appear in no distress, no Rash; Oral Mucosa Clear, moist. Cardiovascular: S1 and S2 Present, no Murmur, Respiratory: normal respiratory effort, Bilateral Air entry present and no Crackles, no wheezes Abdomen: Bowel Sound present, Soft and no tenderness, no hernia Extremities: no Pedal edema, no calf tenderness Neurology: alert and oriented to time, place, and person affect appropriate.  Filed Weights   12/25/23 2040 12/28/23 1345 12/31/23 0401  Weight: 106.6 kg 106.6 kg 100.7 kg   Vitals:   12/31/23 0401 12/31/23 0910  BP: (!) 142/69 136/78  Pulse: 71 82  Resp: 20 18  Temp: 97.6 F (36.4 C) (!) 97.5 F (36.4 C)  SpO2: 96% 97%    DISCHARGE MEDICATION: Allergies as of 12/31/2023       Reactions   Aspirin Swelling, Anaphylaxis   Other reaction(s): Angioedema of tongue   Atorvastatin    Other reaction(s): Unknown   Ezetimibe-simvastatin    Other reaction(s): Unknown   Gabapentin Other (See Comments)   Nightmares when taking   Pravastatin    Other reaction(s): Muscle pain        Medication List     STOP taking these medications    ALPHA LIPOIC ACID PO    amLODipine  5 MG tablet Commonly known as: NORVASC    amoxicillin  500 MG tablet Commonly known as: AMOXIL    brinzolamide 1 % ophthalmic suspension Commonly known as: AZOPT   donepezil 5 MG tablet Commonly known as: ARICEPT   hydrochlorothiazide  25 MG tablet Commonly known as: HYDRODIURIL    HYDROcodone-acetaminophen  5-325 MG tablet Commonly known as: NORCO/VICODIN   timolol  0.5 % ophthalmic solution Commonly known as: TIMOPTIC    Zinc 50 MG Tabs   Zinc Picolinate Powd       TAKE these medications    acetaminophen  500 MG tablet Commonly known as: TYLENOL  Take 1 tablet (500 mg total) by mouth every 6 (six) hours as needed for headache, fever, mild pain (pain score 1-3) or moderate pain (pain score 4-6). What changed:  how much to take when to take this reasons to take this   amiodarone  200 MG tablet Commonly known as: PACERONE  Take 2 tablets (400 mg total) by mouth 2 (two) times daily for 5 days, THEN 1 tablet (200 mg total) daily. Start taking on: December 31, 2023   amoxicillin -clavulanate 875-125 MG tablet Commonly known as: AUGMENTIN  Take 1 tablet by mouth 2 (two) times daily for 2 days.   brimonidine  0.2 % ophthalmic solution Commonly known as: ALPHAGAN  Place 1 drop into the right eye 2 (two) times daily.   clopidogrel  75 MG tablet Commonly known as: PLAVIX  Take 75 mg by mouth daily.   cyanocobalamin  1000 MCG tablet Take 1,000 mcg by mouth daily.   dimenhyDRINATE 50 MG tablet Commonly known as: DRAMAMINE Take 50 mg by mouth at bedtime as needed.   dorzolamide -timolol  2-0.5 % ophthalmic solution Commonly known as: COSOPT  Place 1 drop into both eyes 2 (two) times daily.   Eliquis  5 MG Tabs tablet Generic drug: apixaban  Take 1 tablet (5 mg total) by mouth 2 (two) times daily.   esomeprazole 40 MG capsule Commonly known as: NEXIUM Take 40 mg by mouth daily.   loratadine  10 MG tablet Commonly known as: CLARITIN  Take 10 mg by mouth every other  day.   losartan  50 MG tablet Commonly known as: COZAAR  Take 1 tablet (50 mg total) by mouth daily. What changed:  medication strength how much to take   methocarbamol  500 MG tablet Commonly known as: ROBAXIN  Take 500 mg by mouth as needed for muscle spasms.   metoprolol  tartrate 25 MG tablet Commonly known as: LOPRESSOR  Take 1 tablet (25 mg total) by mouth 2 (two) times daily.   multivitamin tablet Take 1 tablet by mouth daily.   pregabalin  50 MG capsule Commonly known as: LYRICA  Take 50 mg by mouth 3 (three) times daily.   rosuvastatin  20 MG tablet Commonly known as: CRESTOR  Take 20 mg by mouth at bedtime.   sildenafil 100 MG tablet Commonly known as: VIAGRA Take  100 mg by mouth daily as needed for erectile dysfunction.   sodium chloride  flush 0.9 % Soln Commonly known as: NS 5 mLs by Intracatheter route daily.   tamsulosin  0.4 MG Caps capsule Commonly known as: FLOMAX  Take 1 capsule (0.4 mg total) by mouth daily after supper.   traMADol  50 MG tablet Commonly known as: ULTRAM  Take by mouth every 6 (six) hours as needed.   UNABLE TO FIND Compound medication (Golo Release Dietary)   VITAMIN C  PO Take 500 mg by mouth daily.               Discharge Care Instructions  (From admission, onward)           Start     Ordered   12/31/23 0000  Discharge wound care:       Comments: As per IR and general surgery   12/31/23 0957           Allergies  Allergen Reactions   Aspirin Swelling and Anaphylaxis    Other reaction(s): Angioedema of tongue   Atorvastatin     Other reaction(s): Unknown   Ezetimibe-Simvastatin     Other reaction(s): Unknown   Gabapentin Other (See Comments)    Nightmares when taking   Pravastatin     Other reaction(s): Muscle pain   Discharge Instructions     Call MD for:  difficulty breathing, headache or visual disturbances   Complete by: As directed    Call MD for:  extreme fatigue   Complete by: As directed     Call MD for:  persistant dizziness or light-headedness   Complete by: As directed    Call MD for:  persistant nausea and vomiting   Complete by: As directed    Call MD for:  redness, tenderness, or signs of infection (pain, swelling, redness, odor or green/yellow discharge around incision site)   Complete by: As directed    Call MD for:  severe uncontrolled pain   Complete by: As directed    Call MD for:  temperature >100.4   Complete by: As directed    Diet - low sodium heart healthy   Complete by: As directed    Discharge instructions   Complete by: As directed    Follow-up with PCP in 1 week Follow-up with general surgery clinic for drain flushes dental home services start and continue to follow until lap chole will be scheduled.  Patient is on aspirin and Plavix  which needs to be held few days before the surgery.  Follow-up with general surgery for further recommendation Follow-up with cardiology in 1 to 2 weeks   Discharge wound care:   Complete by: As directed    As per IR and general surgery   Increase activity slowly   Complete by: As directed        The results of significant diagnostics from this hospitalization (including imaging, microbiology, ancillary and laboratory) are listed below for reference.    Significant Diagnostic Studies: IR Perc Cholecystostomy Result Date: 12/28/2023 INDICATION: Acute cholecystitis. EXAM: ULTRASOUND AND FLUOROSCOPIC-GUIDED CHOLECYSTOSTOMY TUBE PLACEMENT COMPARISON:  NM HIDA, 12/19/2023.  US  Abdomen, 12/25/2023. MEDICATIONS: Cefoxitin  2 g IV antibiotics were administered within an appropriate time frame prior to skin puncture. ANESTHESIA/SEDATION: Moderate (conscious) sedation was employed during this procedure. A total of Versed  1 mg and Fentanyl  100 mcg was administered intravenously. Moderate Sedation Time: 12 minutes. The patient's level of consciousness and vital signs were monitored continuously by radiology nursing throughout the  procedure under my direct  supervision. CONTRAST:  15mL OMNIPAQUE  IOHEXOL  300 MG/ML SOLN - administered into the gallbladder fossa. FLUOROSCOPY: Radiation Exposure Index and estimated peak skin dose (PSD); Reference air kerma (RAK), 12.3 mGy. COMPLICATIONS: None immediate. PROCEDURE: Informed written consent was obtained from the patient after a discussion of the risks, benefits and alternatives to treatment. Questions regarding the procedure were encouraged and answered. A timeout was performed prior to the initiation of the procedure. The RIGHT upper abdominal quadrant was prepped and draped in the usual sterile fashion, and a sterile drape was applied covering the operative field. Maximum barrier sterile technique with sterile gowns and gloves were used for the procedure. A timeout was performed prior to the initiation of the procedure. Local anesthesia was provided with 1% lidocaine  with epinephrine . Ultrasound scanning of the RIGHT upper quadrant demonstrates a distended gallbladder. Of note, the patient reported pain with ultrasound imaging over the gallbladder. Utilizing a transhepatic approach, a 22 gauge needle was advanced into the gallbladder under direct ultrasound guidance. An ultrasound image was saved for documentation purposes. Appropriate intraluminal puncture was confirmed with the efflux of bile and advancement of an 0.018 wire into the gallbladder lumen. The needle was exchanged for an Accustick set. A small amount of contrast was injected to confirm appropriate intraluminal positioning. Over a Benson wire, a 10 Fr cholecystomy tube was advanced into the gallbladder fossa, coiled and locked. Bile was aspirated and a small amount of contrast was injected as several post procedural spot radiographic images were obtained in various obliquities. The catheter was secured to the skin with suture, connected to a drainage bag and a dressing was placed. The patient tolerated the procedure well without  immediate post procedural complication. IMPRESSION: Successful ultrasound and fluoroscopic guided placement of a 10 Fr cholecystostomy tube. RECOMMENDATIONS: The patient will return to Vascular Interventional Radiology (VIR) for routine drainage catheter evaluation and exchange in 6-8 weeks. Thom Hall, MD [facility] Electronically Signed   By: Thom Hall M.D.   On: 12/28/2023 16:57   NM Hepatobiliary Liver Func Result Date: 12/27/2023 CLINICAL DATA:  Positive blood cultures for E coli.  Cholelithiasis. EXAM: NUCLEAR MEDICINE HEPATOBILIARY IMAGING TECHNIQUE: Sequential images of the abdomen were obtained out to 60 minutes following intravenous administration of radiopharmaceutical. RADIOPHARMACEUTICALS:  5.3 mCi Tc-47m  Choletec  IV COMPARISON:  Ultrasound abdomen 12/25/2023 FINDINGS: There is prompt and homogeneous uptake of activity throughout the liver with progressive washout on delayed imaging. Prompt appearance of activity in the bile ducts and small bowel suggesting no evidence of common duct obstruction. No activity is demonstrated in the gallbladder on early or delayed imaging. Nonvisualization of the gallbladder is consistent with acute cholecystitis. IMPRESSION: 1. Nonvisualization of the gallbladder consistent with acute cholecystitis. 2. No evidence of bile duct obstruction. Electronically Signed   By: Elsie Gravely M.D.   On: 12/27/2023 17:29   ECHOCARDIOGRAM COMPLETE Result Date: 12/26/2023    ECHOCARDIOGRAM REPORT   Patient Name:   Shawn Hart Date of Exam: 12/26/2023 Medical Rec #:  [id number]        Height:       71.0 in Accession #:    [id number]       Weight:       235.0 lb Date of Birth:  [birth year]        BSA:          2.258 m Patient Age:    83 years         BP:  104/63 mmHg Patient Gender: M                HR:           73 bpm. Exam Location:  [facility] Procedure: 2D Echo, Cardiac Doppler, Color Doppler and  Intracardiac            Opacification Agent (Both Spectral and Color Flow Doppler were            utilized during procedure). Indications:     Atrial Fibrillation I48.91  History:         Patient has no prior history of Echocardiogram examinations.                  Previous Myocardial Infarction and CAD. Stent in 2007 per                  patient.  Sonographer:     Ashley McNeely-Sloane Referring Phys:  [id number] DORENE COMFORT Diagnosing Phys: Marsa Dooms MD IMPRESSIONS  1. Left ventricular ejection fraction, by estimation, is 45 to 50%. The left ventricle has mildly decreased function. The left ventricle has no regional wall motion abnormalities. Left ventricular diastolic parameters were normal.  2. Right ventricular systolic function is normal. The right ventricular size is normal.  3. The mitral valve is normal in structure. Mild mitral valve regurgitation. No evidence of mitral stenosis.  4. The aortic valve is normal in structure. Aortic valve regurgitation is not visualized. No aortic stenosis is present.  5. The inferior vena cava is normal in size with greater than 50% respiratory variability, suggesting right atrial pressure of 3 mmHg. FINDINGS  Left Ventricle: Left ventricular ejection fraction, by estimation, is 45 to 50%. The left ventricle has mildly decreased function. The left ventricle has no regional wall motion abnormalities. Definity  contrast agent was given IV to delineate the left ventricular endocardial borders. Strain was performed and the global longitudinal strain is indeterminate. The left ventricular internal cavity size was normal in size. There is no left ventricular hypertrophy. Left ventricular diastolic parameters were normal. Right Ventricle: The right ventricular size is normal. No increase in right ventricular wall thickness. Right ventricular systolic function is normal. Left Atrium: Left atrial size was normal in size. Right Atrium: Right atrial size was normal in  size. Pericardium: There is no evidence of pericardial effusion. Mitral Valve: The mitral valve is normal in structure. Mild mitral valve regurgitation. No evidence of mitral valve stenosis. MV peak gradient, 2.8 mmHg. The mean mitral valve gradient is 1.0 mmHg. Tricuspid Valve: The tricuspid valve is normal in structure. Tricuspid valve regurgitation is mild . No evidence of tricuspid stenosis. Aortic Valve: The aortic valve is normal in structure. Aortic valve regurgitation is not visualized. No aortic stenosis is present. Aortic valve mean gradient measures 2.0 mmHg. Aortic valve peak gradient measures 3.2 mmHg. Aortic valve area, by VTI measures 2.54 cm. Pulmonic Valve: The pulmonic valve was normal in structure. Pulmonic valve regurgitation is not visualized. No evidence of pulmonic stenosis. Aorta: The aortic root is normal in size and structure. Venous: The inferior vena cava is normal in size with greater than 50% respiratory variability, suggesting right atrial pressure of 3 mmHg. IAS/Shunts: No atrial level shunt detected by color flow Doppler. Additional Comments: 3D was performed not requiring image post processing on an independent workstation and was indeterminate.  LEFT VENTRICLE PLAX 2D LVIDd:         4.50 cm     Diastology LVIDs:  3.50 cm     LV e' medial:    5.51 cm/s LV PW:         1.20 cm     LV E/e' medial:  10.6 LV IVS:        1.10 cm     LV e' lateral:   6.57 cm/s LVOT diam:     2.00 cm     LV E/e' lateral: 8.9 LV SV:         45 LV SV Index:   20 LVOT Area:     3.14 cm  LV Volumes (MOD) LV vol d, MOD A2C: 58.6 ml LV vol d, MOD A4C: 89.3 ml LV vol s, MOD A2C: 29.9 ml LV vol s, MOD A4C: 38.2 ml LV SV MOD A2C:     28.7 ml LV SV MOD A4C:     89.3 ml LV SV MOD BP:      38.2 ml RIGHT VENTRICLE RV Basal diam:  5.60 cm RV Mid diam:    4.70 cm RV S prime:     10.30 cm/s TAPSE (M-mode): 2.0 cm LEFT ATRIUM         Index LA diam:    3.20 cm 1.42 cm/m  AORTIC VALVE                    PULMONIC  VALVE AV Area (Vmax):    2.49 cm     PV Vmax:        0.75 m/s AV Area (Vmean):   2.36 cm     PV Vmean:       52.100 cm/s AV Area (VTI):     2.54 cm     PV VTI:         0.164 m AV Vmax:           89.80 cm/s   PV Peak grad:   2.2 mmHg AV Vmean:          62.700 cm/s  PV Mean grad:   1.0 mmHg AV VTI:            0.178 m      RVOT Peak grad: 2 mmHg AV Peak Grad:      3.2 mmHg AV Mean Grad:      2.0 mmHg LVOT Vmax:         71.30 cm/s LVOT Vmean:        47.200 cm/s LVOT VTI:          0.144 m LVOT/AV VTI ratio: 0.81  AORTA Ao Root diam: 2.90 cm Ao Asc diam:  3.40 cm MITRAL VALVE               TRICUSPID VALVE MV Area (PHT): 4.31 cm    TR Peak grad:   14.6 mmHg MV Area VTI:   1.98 cm    TR Vmax:        191.00 cm/s MV Peak grad:  2.8 mmHg MV Mean grad:  1.0 mmHg    SHUNTS MV Vmax:       0.84 m/s    Systemic VTI:  0.14 m MV Vmean:      57.1 cm/s   Systemic Diam: 2.00 cm MV Decel Time: 176 msec    Pulmonic VTI:  0.144 m MV E velocity: 58.20 cm/s MV A velocity: 57.60 cm/s MV E/A ratio:  1.01 Marsa Dooms MD Electronically signed by Marsa Dooms MD Signature Date/Time: 12/26/2023/4:08:56 PM    Final    CT Head Wo  Contrast Result Date: 12/25/2023 CLINICAL DATA:  Altered mental status EXAM: CT HEAD WITHOUT CONTRAST TECHNIQUE: Contiguous axial images were obtained from the base of the skull through the vertex without intravenous contrast. RADIATION DOSE REDUCTION: This exam was performed according to the departmental dose-optimization program which includes automated exposure control, adjustment of the mA and/or kV according to patient size and/or use of iterative reconstruction technique. COMPARISON:  01/22/2022 FINDINGS: Brain: No evidence of acute infarction, hemorrhage, hydrocephalus, extra-axial collection or mass lesion/mass effect. Chronic atrophic changes are noted. Vascular: No hyperdense vessel or unexpected calcification. Skull: Normal. Negative for fracture or focal lesion. Sinuses/Orbits: No acute  finding. Other: None. IMPRESSION: Chronic atrophic changes without acute abnormality. Electronically Signed   By: Oneil Devonshire M.D.   On: 12/25/2023 23:18   US  ABDOMEN LIMITED RUQ (LIVER/GB) Result Date: 12/25/2023 CLINICAL DATA:  Elevated liver function tests. EXAM: ULTRASOUND ABDOMEN LIMITED RIGHT UPPER QUADRANT COMPARISON:  None Available. FINDINGS: Gallbladder: A 2.2 cm shadowing echogenic gallstone is seen within the gallbladder lumen. There is no evidence of gallbladder wall thickening (2.9 mm). No sonographic Murphy sign noted by sonographer. Common bile duct: Diameter: 3.2 mm Liver: The left lobe of the liver is limited in visualization secondary to overlying bowel gas. No focal lesion identified. Diffusely increased echogenicity of the liver parenchyma is noted. Portal vein is patent on color Doppler imaging with normal direction of blood flow towards the liver. Other: None. IMPRESSION: 1. Cholelithiasis. 2. Hepatic steatosis. Electronically Signed   By: Suzen Dials M.D.   On: 12/25/2023 23:03   DG Chest Port 1 View Result Date: 12/25/2023 CLINICAL DATA:  Weakness, nausea, and chills.  Tachycardia. EXAM: PORTABLE CHEST 1 VIEW COMPARISON:  01/22/2022 FINDINGS: Shallow inspiration. Heart size and pulmonary vascularity are normal. Vascular crowding in the lung bases. No airspace disease or consolidation in the lungs. No pleural effusion or pneumothorax. Mediastinal contours appear intact. Degenerative changes in the spine and shoulders. IMPRESSION: No active disease. Electronically Signed   By: Elsie Gravely M.D.   On: 12/25/2023 21:35    Microbiology: Recent Results (from the past 240 hours)  Culture, blood (Routine x 2)     Status: None   Collection Time: 12/25/23  8:45 PM   Specimen: BLOOD  Result Value Ref Range Status   Specimen Description BLOOD BLOOD LEFT FOREARM  Final   Special Requests   Final    BOTTLES DRAWN AEROBIC AND ANAEROBIC Blood Culture adequate volume   Culture    Final    NO GROWTH 5 DAYS Performed at [facility], [address] [place], [place] [address]    Report Status 12/30/2023 FINAL  Final  Resp panel by RT-PCR (RSV, Flu A&B, Covid) Anterior Nasal Swab     Status: None   Collection Time: 12/25/23  8:45 PM   Specimen: Anterior Nasal Swab  Result Value Ref Range Status   SARS Coronavirus 2 by RT PCR NEGATIVE NEGATIVE Final    Comment: (NOTE) SARS-CoV-2 target nucleic acids are NOT DETECTED.  The SARS-CoV-2 RNA is generally detectable in upper respiratory specimens during the acute phase of infection. The lowest concentration of SARS-CoV-2 viral copies this assay can detect is 138 copies/mL. A negative result does not preclude SARS-Cov-2 infection and should not be used as the sole basis for treatment or other patient management decisions. A negative result may occur with  improper specimen collection/handling, submission of specimen other than nasopharyngeal swab, presence of viral mutation(s) within the areas targeted by this assay,  and inadequate number of viral copies(<138 copies/mL). A negative result must be combined with clinical observations, patient history, and epidemiological information. The expected result is Negative.  Fact Sheet for Patients:  BloggerCourse.com  Fact Sheet for Healthcare Providers:  SeriousBroker.it  This test is no t yet approved or cleared by the [place]  FDA and  has been authorized for detection and/or diagnosis of SARS-CoV-2 by FDA under an Emergency Use Authorization (EUA). This EUA will remain  in effect (meaning this test can be used) for the duration of the COVID-19 declaration under Section 564(b)(1) of the Act, 21 U.S.C.section 360bbb-3(b)(1), unless the authorization is terminated  or revoked sooner.       Influenza A by PCR NEGATIVE NEGATIVE Final   Influenza B by PCR NEGATIVE NEGATIVE Final    Comment: (NOTE) The  Xpert Xpress SARS-CoV-2/FLU/RSV plus assay is intended as an aid in the diagnosis of influenza from Nasopharyngeal swab specimens and should not be used as a sole basis for treatment. Nasal washings and aspirates are unacceptable for Xpert Xpress SARS-CoV-2/FLU/RSV testing.  Fact Sheet for Patients: BloggerCourse.com  Fact Sheet for Healthcare Providers: SeriousBroker.it  This test is not yet approved or cleared by the [place]  FDA and has been authorized for detection and/or diagnosis of SARS-CoV-2 by FDA under an Emergency Use Authorization (EUA). This EUA will remain in effect (meaning this test can be used) for the duration of the COVID-19 declaration under Section 564(b)(1) of the Act, 21 U.S.C. section 360bbb-3(b)(1), unless the authorization is terminated or revoked.     Resp Syncytial Virus by PCR NEGATIVE NEGATIVE Final    Comment: (NOTE) Fact Sheet for Patients: BloggerCourse.com  Fact Sheet for Healthcare Providers: SeriousBroker.it  This test is not yet approved or cleared by the [place]  FDA and has been authorized for detection and/or diagnosis of SARS-CoV-2 by FDA under an Emergency Use Authorization (EUA). This EUA will remain in effect (meaning this test can be used) for the duration of the COVID-19 declaration under Section 564(b)(1) of the Act, 21 U.S.C. section 360bbb-3(b)(1), unless the authorization is terminated or revoked.  Performed at [facility], [address] [place], [place] [address]   Culture, blood (Routine x 2)     Status: Abnormal   Collection Time: 12/25/23  9:09 PM   Specimen: BLOOD  Result Value Ref Range Status   Specimen Description   Final    BLOOD LEFT ANTECUBITAL Performed at [facility], [address] [place], [place] [address]    Special Requests   Final    BOTTLES DRAWN AEROBIC AND ANAEROBIC  Blood Culture adequate volume Performed at [facility], [address], [place], [place] [address]    Culture  Setup Time   Final    GRAM NEGATIVE RODS AEROBIC BOTTLE ONLY CRITICAL RESULT CALLED TO, READ BACK BY AND VERIFIED WITH: MADISON HUNT 12/26/23 1349 MU Performed at [facility], [address], [place], [place] [address]    Culture ESCHERICHIA COLI (A)  Final   Report Status 12/28/2023 FINAL  Final   Organism ID, Bacteria ESCHERICHIA COLI  Final      Susceptibility   Escherichia coli - MIC*    AMPICILLIN <=2 SENSITIVE Sensitive     CEFAZOLIN (NON-URINE) <=1 SENSITIVE Sensitive     CEFEPIME <=0.12 SENSITIVE Sensitive     ERTAPENEM <=0.12 SENSITIVE Sensitive     CEFTRIAXONE <=0.25 SENSITIVE Sensitive     CIPROFLOXACIN <=0.06 SENSITIVE Sensitive  GENTAMICIN <=1 SENSITIVE Sensitive     MEROPENEM <=0.25 SENSITIVE Sensitive     TRIMETH/SULFA <=20 SENSITIVE Sensitive     AMPICILLIN/SULBACTAM <=2 SENSITIVE Sensitive     PIP/TAZO Value in next row Sensitive ug/mL     <=4 SENSITIVEThis is a modified FDA-approved test that has been validated and its performance characteristics determined by the reporting laboratory.  This laboratory is certified under the Clinical Laboratory Improvement Amendments CLIA as qualified to perform high complexity clinical laboratory testing.    * ESCHERICHIA COLI  Blood Culture ID Panel (Reflexed)     Status: Abnormal   Collection Time: 12/25/23  9:09 PM  Result Value Ref Range Status   Enterococcus faecalis NOT DETECTED NOT DETECTED Final   Enterococcus Faecium NOT DETECTED NOT DETECTED Final   Listeria monocytogenes NOT DETECTED NOT DETECTED Final   Staphylococcus species NOT DETECTED NOT DETECTED Final   Staphylococcus aureus (BCID) NOT DETECTED NOT DETECTED Final   Staphylococcus epidermidis NOT DETECTED NOT DETECTED Final   Staphylococcus lugdunensis NOT DETECTED NOT DETECTED Final   Streptococcus species NOT DETECTED NOT DETECTED  Final   Streptococcus agalactiae NOT DETECTED NOT DETECTED Final   Streptococcus pneumoniae NOT DETECTED NOT DETECTED Final   Streptococcus pyogenes NOT DETECTED NOT DETECTED Final   A.calcoaceticus-baumannii NOT DETECTED NOT DETECTED Final   Bacteroides fragilis NOT DETECTED NOT DETECTED Final   Enterobacterales DETECTED (A) NOT DETECTED Final    Comment: Enterobacterales represent a large order of gram negative bacteria, not a single organism.   Enterobacter cloacae complex NOT DETECTED NOT DETECTED Final   Escherichia coli DETECTED (A) NOT DETECTED Final    Comment: CRITICAL RESULT CALLED TO, READ BACK BY AND VERIFIED WITH: MADISON HUNT 12/26/23 1349 MU    Klebsiella aerogenes NOT DETECTED NOT DETECTED Final   Klebsiella oxytoca NOT DETECTED NOT DETECTED Final   Klebsiella pneumoniae NOT DETECTED NOT DETECTED Final   Proteus species NOT DETECTED NOT DETECTED Final   Salmonella species NOT DETECTED NOT DETECTED Final   Serratia marcescens NOT DETECTED NOT DETECTED Final   Haemophilus influenzae NOT DETECTED NOT DETECTED Final   Neisseria meningitidis NOT DETECTED NOT DETECTED Final   Pseudomonas aeruginosa NOT DETECTED NOT DETECTED Final   Stenotrophomonas maltophilia NOT DETECTED NOT DETECTED Final   Candida albicans NOT DETECTED NOT DETECTED Final   Candida auris NOT DETECTED NOT DETECTED Final   Candida glabrata NOT DETECTED NOT DETECTED Final   Candida krusei NOT DETECTED NOT DETECTED Final   Candida parapsilosis NOT DETECTED NOT DETECTED Final   Candida tropicalis NOT DETECTED NOT DETECTED Final   Cryptococcus neoformans/gattii NOT DETECTED NOT DETECTED Final   CTX-M ESBL NOT DETECTED NOT DETECTED Final   Carbapenem resistance IMP NOT DETECTED NOT DETECTED Final   Carbapenem resistance KPC NOT DETECTED NOT DETECTED Final   Carbapenem resistance NDM NOT DETECTED NOT DETECTED Final   Carbapenem resist OXA 48 LIKE NOT DETECTED NOT DETECTED Final   Carbapenem resistance VIM NOT  DETECTED NOT DETECTED Final    Comment: Performed at [facility], [address] [place], [place] [address]  Aerobic/Anaerobic Culture w Gram Stain (surgical/deep wound)     Status: None (Preliminary result)   Collection Time: 12/28/23  3:06 PM   Specimen: BILE  Result Value Ref Range Status   Specimen Description   Final    BILE Performed at [facility], [address], [place], [place] [address]    Special Requests   Final    NONE Performed at  [facility], [address], [place], [place] [address]    Gram Stain   Final    NO WBC SEEN NO ORGANISMS SEEN Performed at [facility], [address], [place], [place] [address]    Culture   Final    ABUNDANT ESCHERICHIA COLI NO ANAEROBES ISOLATED; CULTURE IN PROGRESS FOR 5 DAYS    Report Status PENDING  Incomplete   Organism ID, Bacteria ESCHERICHIA COLI  Final      Susceptibility   Escherichia coli - MIC*    AMPICILLIN <=2 SENSITIVE Sensitive     CEFAZOLIN (NON-URINE) <=1 SENSITIVE Sensitive     CEFEPIME <=0.12 SENSITIVE Sensitive     ERTAPENEM <=0.12 SENSITIVE Sensitive     CEFTRIAXONE <=0.25 SENSITIVE Sensitive     CIPROFLOXACIN <=0.06 SENSITIVE Sensitive     GENTAMICIN <=1 SENSITIVE Sensitive     MEROPENEM <=0.25 SENSITIVE Sensitive     TRIMETH/SULFA <=20 SENSITIVE Sensitive     AMPICILLIN/SULBACTAM <=2 SENSITIVE Sensitive     PIP/TAZO Value in next row Sensitive ug/mL     <=4 SENSITIVEThis is a modified FDA-approved test that has been validated and its performance characteristics determined by the reporting laboratory.  This laboratory is certified under the Clinical Laboratory Improvement Amendments CLIA as qualified to perform high complexity clinical laboratory testing.    * ABUNDANT ESCHERICHIA COLI     Labs: CBC: Recent Labs  Lab 12/25/23 2045 12/26/23 0447 12/27/23 0446 12/28/23 0541 12/29/23 0507 12/30/23 0430 12/31/23 0346  WBC 15.5*   < > 14.5* 11.3* 14.8* 11.8*  10.7*  NEUTROABS 14.1*  --   --   --   --   --   --   HGB 15.0   < > 13.7 13.2 13.7 13.0 13.6  HCT 42.6   < > 40.4 38.5* 39.3 37.7* 40.1  MCV 86.1   < > 88.2 86.7 85.8 86.5 86.6  PLT 239   < > 222 197 208 217 258   < > = values in this interval not displayed.   Basic Metabolic Panel: Recent Labs  Lab 12/27/23 0446 12/28/23 0541 12/29/23 0507 12/30/23 0430 12/31/23 0346  NA 135 132* 131* 133* 134*  K 3.4* 3.6 3.4* 3.4* 3.8  CL 106 102 102 103 105  CO2 21* 23 20* 22 21*  GLUCOSE 104* 107* 109* 114* 101*  BUN 14 12 13 12 14   CREATININE 0.67 0.84 0.73 0.50* 0.81  CALCIUM  8.4* 8.2* 8.4* 8.3* 8.8*  MG 2.0 1.9 1.9 2.0 2.1  PHOS 2.0* 2.6 2.6 2.6 3.8   Liver Function Tests: Recent Labs  Lab 12/27/23 0446 12/28/23 0541 12/29/23 0507 12/30/23 0430 12/31/23 0346  AST 141* 64* 40 28 28  ALT 244* 152* 112* 80* 68*  ALKPHOS 164* 152* 143* 119 117  BILITOT 1.7* 1.3* 1.3* 0.8 0.7  PROT 6.6 6.2* 6.7 6.7 6.7  ALBUMIN 3.3* 2.9* 3.0* 2.9* 3.1*   Recent Labs  Lab 12/27/23 0446  LIPASE 28   No results for input(s): AMMONIA in the last 168 hours. Cardiac Enzymes: No results for input(s): CKTOTAL, CKMB, CKMBINDEX, TROPONINI in the last 168 hours. BNP (last 3 results) No results for input(s): BNP in the last 8760 hours. CBG: No results for input(s): GLUCAP in the last 168 hours.  Time spent: 35 minutes  Signed:  Elvan Sor  [facility] 12/31/2023 9:57 AM

## 2023-12-31 NOTE — Progress Notes (Signed)
 Subjective:  CC: Shawn Hart is a 83 y.o. male  Hospital stay day 5,   IR guided chole tube placement for acute cholecystitis  HPI: No issues  ROS:  Pertinent positives and negatives noted in the HPI.    Objective:   Temp:  [97.4 F (36.3 C)-98 F (36.7 C)] 97.6 F (36.4 C) (09/01 0401) Pulse Rate:  [62-79] 71 (09/01 0401) Resp:  [16-20] 20 (09/01 0401) BP: (114-142)/(68-97) 142/69 (09/01 0401) SpO2:  [96 %-98 %] 96 % (09/01 0401) Weight:  [100.7 kg] 100.7 kg (09/01 0401)     Height: 5' 11 (180.3 cm) Weight: 100.7 kg BMI (Calculated): 30.98   Intake/Output this shift:   Intake/Output Summary (Last 24 hours) at 12/31/2023 0829 Last data filed at 12/31/2023 9341 Gross per 24 hour  Intake 2005.86 ml  Output 250 ml  Net 1755.86 ml    Constitutional :  alert, cooperative, appears stated age, and no distress  Respiratory:  Clear to auscultation bilaterally  Cardiovascular:  Regular rate and rhythm  Gastrointestinal: Soft, no guarding, chole tube with bilious drainage.   Skin: Cool and moist.   Psychiatric: Normal affect, non-agitated, not confused       LABS:     Latest Ref Rng & Units 12/31/2023    3:46 AM 12/30/2023    4:30 AM 12/29/2023    5:07 AM  CMP  Glucose 70 - 99 mg/dL 898  885  890   BUN 8 - 23 mg/dL 14  12  13    Creatinine 0.61 - 1.24 mg/dL 9.18  9.49  9.26   Sodium 135 - 145 mmol/L 134  133  131   Potassium 3.5 - 5.1 mmol/L 3.8  3.4  3.4   Chloride 98 - 111 mmol/L 105  103  102   CO2 22 - 32 mmol/L 21  22  20    Calcium  8.9 - 10.3 mg/dL 8.8  8.3  8.4   Total Protein 6.5 - 8.1 g/dL 6.7  6.7  6.7   Total Bilirubin 0.0 - 1.2 mg/dL 0.7  0.8  1.3   Alkaline Phos 38 - 126 U/L 117  119  143   AST 15 - 41 U/L 28  28  40   ALT 0 - 44 U/L 68  80  112       Latest Ref Rng & Units 12/31/2023    3:46 AM 12/30/2023    4:30 AM 12/29/2023    5:07 AM  CBC  WBC 4.0 - 10.5 K/uL 10.7  11.8  14.8   Hemoglobin 13.0 - 17.0 g/dL 86.3  86.9  86.2   Hematocrit 39.0 - 52.0  % 40.1  37.7  39.3   Platelets 150 - 400 K/uL 258  217  208     RADS: N/a Assessment:   IR guided chole tube placement for acute cholecystitis  drain flush daily in our office until Home health can start seeing him on the 9/5. ok to be d/c'd from surgery standpoint.     labs/images/medications/previous chart entries reviewed personally and relevant changes/updates noted above.

## 2024-01-02 LAB — AEROBIC/ANAEROBIC CULTURE W GRAM STAIN (SURGICAL/DEEP WOUND): Gram Stain: NONE SEEN

## 2024-02-06 ENCOUNTER — Encounter: Payer: Self-pay | Admitting: Urgent Care

## 2024-02-07 ENCOUNTER — Inpatient Hospital Stay: Admission: RE | Admit: 2024-02-07 | Source: Ambulatory Visit

## 2024-02-14 ENCOUNTER — Encounter: Admission: RE | Payer: Self-pay | Source: Home / Self Care

## 2024-02-14 ENCOUNTER — Ambulatory Visit: Admission: RE | Admit: 2024-02-14 | Source: Home / Self Care | Admitting: Surgery

## 2024-02-14 SURGERY — CHOLECYSTECTOMY, ROBOT-ASSISTED, LAPAROSCOPIC
Anesthesia: General | Site: Abdomen

## 2024-02-21 ENCOUNTER — Ambulatory Visit: Payer: Self-pay | Admitting: Surgery

## 2024-02-21 NOTE — H&P (Signed)
 Subjective:   CC: Acute cholecystitis [K81.0]   HPI: History of Present Illness No issues since d/c.  Current Medications: has a current medication list which includes the following prescription(s): acetaminophen , acetaminophen , alpha lipoic acid (bulk), amiodarone , amlodipine , amoxicillin , apixaban , brimonidine , clopidogrel , compound medication, cyanocobalamin , dimenhydrinate, dorzolamide -timolol , dorzolamide -timolol , esomeprazole, hydrochlorothiazide , hydrocodone-acetaminophen , loratadine , losartan , methocarbamol , metoprolol  tartrate, multivitamin, pregabalin , rosuvastatin , tamsulosin , tramadol , ergocalciferol (vitamin d2), and zinc.  Allergies:  Allergies  Allergen Reactions  Aspirin Anaphylaxis  Gabapentin Other (See Comments)  Nightmares when taking  Lipitor [Atorvastatin] Unknown  Vytorin 10-10 [Ezetimibe-Simvastatin] Unknown   ROS: Pertinent positives and negatives noted in HPI   Objective:    BP 133/67  Pulse 74  Ht 180.3 cm (5' 11)  Wt (!) 105.7 kg (233 lb)  BMI 32.50 kg/m   Constitutional : Alert, no distress, cooperative  Gastrointestinal: soft, non-tender; bowel sounds normal; no masses, no organomegaly.  Musculoskeletal: Steady gait and movement  Skin: Cool and moist, drain with more thin bilious output today  Psychiatric: Normal affect, non-agitated, not confused    LABS:  N/A   RADS: N/A  Assessment:    Acute cholecystitis [K81.0] S/p perc chole drain placement.  Plan:    1. As expected. Drain flushed with 10ml saline after clamping bag end. Easy flush. Bag unclamped. Will proceed with interval cholecystectomy in next 2-3 weeks  Discussed the risk of surgery including post-op infxn, seroma, biloma, chronic pain, poor-delayed wound healing, retained gallstone, conversion to open procedure, post-op SBO or ileus, and need for additional procedures to address said risks. The risks of general anesthetic including MI, CVA, sudden death or even  reaction to anesthetic medications also discussed. Alternatives include continued observation. Benefits include possible symptom relief, prevention of complications including acute cholecystitis, pancreatitis.  Typical post operative recovery of 3-5 days rest, continued pain in area and incision sites, possible loose stools up to 4-6 weeks, also discussed.  ED return precautions given for sudden increase in RUQ pain, with possible accompanying fever, nausea, and/or vomiting.  The patient understands the risks, any and all questions were answered to the patient's satisfaction.  labs/images/medications/previous chart entries reviewed personally and relevant changes/updates noted above.

## 2024-02-21 NOTE — H&P (View-Only) (Signed)
 Subjective:   CC: Acute cholecystitis [K81.0]   HPI: History of Present Illness No issues since d/c.  Current Medications: has a current medication list which includes the following prescription(s): acetaminophen , acetaminophen , alpha lipoic acid (bulk), amiodarone , amlodipine , amoxicillin , apixaban , brimonidine , clopidogrel , compound medication, cyanocobalamin , dimenhydrinate, dorzolamide -timolol , dorzolamide -timolol , esomeprazole, hydrochlorothiazide , hydrocodone-acetaminophen , loratadine , losartan , methocarbamol , metoprolol  tartrate, multivitamin, pregabalin , rosuvastatin , tamsulosin , tramadol , ergocalciferol (vitamin d2), and zinc.  Allergies:  Allergies  Allergen Reactions  Aspirin Anaphylaxis  Gabapentin Other (See Comments)  Nightmares when taking  Lipitor [Atorvastatin] Unknown  Vytorin 10-10 [Ezetimibe-Simvastatin] Unknown   ROS: Pertinent positives and negatives noted in HPI   Objective:    BP 133/67  Pulse 74  Ht 180.3 cm (5' 11)  Wt (!) 105.7 kg (233 lb)  BMI 32.50 kg/m   Constitutional : Alert, no distress, cooperative  Gastrointestinal: soft, non-tender; bowel sounds normal; no masses, no organomegaly.  Musculoskeletal: Steady gait and movement  Skin: Cool and moist, drain with more thin bilious output today  Psychiatric: Normal affect, non-agitated, not confused    LABS:  N/A   RADS: N/A  Assessment:    Acute cholecystitis [K81.0] S/p perc chole drain placement.  Plan:    1. As expected. Drain flushed with 10ml saline after clamping bag end. Easy flush. Bag unclamped. Will proceed with interval cholecystectomy in next 2-3 weeks  Discussed the risk of surgery including post-op infxn, seroma, biloma, chronic pain, poor-delayed wound healing, retained gallstone, conversion to open procedure, post-op SBO or ileus, and need for additional procedures to address said risks. The risks of general anesthetic including MI, CVA, sudden death or even  reaction to anesthetic medications also discussed. Alternatives include continued observation. Benefits include possible symptom relief, prevention of complications including acute cholecystitis, pancreatitis.  Typical post operative recovery of 3-5 days rest, continued pain in area and incision sites, possible loose stools up to 4-6 weeks, also discussed.  ED return precautions given for sudden increase in RUQ pain, with possible accompanying fever, nausea, and/or vomiting.  The patient understands the risks, any and all questions were answered to the patient's satisfaction.  labs/images/medications/previous chart entries reviewed personally and relevant changes/updates noted above.

## 2024-02-22 ENCOUNTER — Encounter
Admission: RE | Admit: 2024-02-22 | Discharge: 2024-02-22 | Disposition: A | Discharge status: Home / Self Care | Source: Ambulatory Visit | Attending: Surgery | Admitting: Surgery

## 2024-02-22 ENCOUNTER — Other Ambulatory Visit: Payer: Self-pay

## 2024-02-22 HISTORY — DX: Localized edema: R60.0

## 2024-02-22 HISTORY — DX: Prediabetes: R73.03

## 2024-02-22 HISTORY — DX: Supraventricular tachycardia, unspecified: I47.10

## 2024-02-22 HISTORY — DX: Acute cholecystitis: K81.0

## 2024-02-22 HISTORY — DX: Benign prostatic hyperplasia without lower urinary tract symptoms: N40.0

## 2024-02-22 HISTORY — DX: Presence of coronary angioplasty implant and graft: Z95.5

## 2024-02-22 HISTORY — DX: Other specified abnormal findings of blood chemistry: R79.89

## 2024-02-22 HISTORY — DX: Diverticulosis of intestine, part unspecified, without perforation or abscess without bleeding: K57.90

## 2024-02-22 HISTORY — DX: Unspecified dementia, unspecified severity, without behavioral disturbance, psychotic disturbance, mood disturbance, and anxiety: F03.90

## 2024-02-22 HISTORY — DX: Paroxysmal atrial fibrillation: I48.0

## 2024-02-22 NOTE — Patient Instructions (Signed)
 Your procedure is scheduled on:02-29-24 Friday Report to the Registration Desk on the 1st floor of the [facility].Then proceed to the 2nd floor Surgery Desk To find out your arrival time, please call [phone] between 1PM - 3PM on:02-28-24 Thursday If your arrival time is 6:00 am, do not arrive before that time as the [facility] entrance doors do not open until 6:00 am.  REMEMBER: Instructions that are not followed completely may result in serious medical risk, up to and including death; or upon the discretion of your surgeon and anesthesiologist your surgery may need to be rescheduled.  Do not eat food after midnight the night before surgery.  No gum chewing or hard candies.  You may however, drink CLEAR liquids up to 2 hours before you are scheduled to arrive for your surgery. Do not drink anything within 2 hours of your scheduled arrival time.  Clear liquids include: - water  - apple juice without pulp - gatorade (not RED colors) - black coffee or tea (Do NOT add milk or creamers to the coffee or tea) Do NOT drink anything that is not on this list.  One week prior to surgery:Stop NOW (02-22-24) Stop Anti-inflammatories (NSAIDS) such as Advil, Aleve, Ibuprofen, Motrin, Naproxen, Naprosyn and Aspirin based products such as Excedrin, Goody's Powder, BC Powder. Stop ANY OVER THE COUNTER supplements until after surgery (Vitamin C , Vitamin B12, Vitamin D3, Multivitamin, Golo Release Dietary)  You may however, continue to take Tylenol  if needed for pain up until the day of surgery.  Stop clopidogrel  (PLAVIX ) 5 days prior to surgery-Last dose will be on 02-23-24 Saturday  Stop apixaban  (ELIQUIS ) 2 days prior to surgery-Last dose will be on 02-26-24 Tuesday  Continue taking all of your other prescription medications up until the day of surgery.  ON THE DAY OF SURGERY ONLY TAKE THESE MEDICATIONS WITH SIPS OF WATER: -amiodarone  (PACERONE )  -esomeprazole (NEXIUM)  -metoprolol   tartrate (LOPRESSOR )  -pregabalin  (LYRICA )   No Alcohol for 24 hours before or after surgery.  No Smoking including e-cigarettes for 24 hours before surgery.  No chewable tobacco products for at least 6 hours before surgery.  No nicotine patches on the day of surgery.  Do not use any recreational drugs for at least a week (preferably 2 weeks) before your surgery.  Please be advised that the combination of cocaine and anesthesia may have negative outcomes, up to and including death. If you test positive for cocaine, your surgery will be cancelled.  On the morning of surgery brush your teeth with toothpaste and water, you may rinse your mouth with mouthwash if you wish. Do not swallow any toothpaste or mouthwash.  Use CHG wipes as directed on instruction sheet.  Do not wear jewelry, make-up, hairpins, clips or nail polish.  For welded (permanent) jewelry: bracelets, anklets, waist bands, etc.  Please have this removed prior to surgery.  If it is not removed, there is a chance that hospital personnel will need to cut it off on the day of surgery.  Do not wear lotions, powders, or perfumes.   Do not shave body hair from the neck down 48 hours before surgery.  Contact lenses, hearing aids and dentures may not be worn into surgery.  Do not bring valuables to the hospital. [facility] is not responsible for any missing/lost belongings or valuables.   Notify your doctor if there is any change in your medical condition (cold, fever, infection).  Wear comfortable clothing (specific to your surgery type) to the  hospital.  After surgery, you can help prevent lung complications by doing breathing exercises.  Take deep breaths and cough every 1-2 hours. Your doctor may order a device called an Incentive Spirometer to help you take deep breaths. When coughing or sneezing, hold a pillow firmly against your incision with both hands. This is called "splinting." Doing this helps protect your  incision. It also decreases belly discomfort.  If you are being admitted to the hospital overnight, leave your suitcase in the car. After surgery it may be brought to your room.  In case of increased patient census, it may be necessary for you, the patient, to continue your postoperative care in the Same Day Surgery department.  If you are being discharged the day of surgery, you will not be allowed to drive home. You will need a responsible individual to drive you home and stay with you for 24 hours after surgery.   If you are taking public transportation, you will need to have a responsible individual with you.  Please call the Pre-admissions Testing Dept. at [phone] if you have any questions about these instructions.  Surgery Visitation Policy:  Patients having surgery or a procedure may have two visitors.  Children under the age of 1 must have an adult with them who is not the patient.  Preparing the Skin Before Surgery  To help prevent the risk of infection at your surgical site, we are now providing you with rinse-free Sage 2% Chlorhexidine Gluconate (CHG) disposable wipes.  Chlorhexidine Gluconate (CHG) Soap  o An antiseptic cleaner that kills germs and bonds with the skin to continue killing germs even after washing  o Used for showering the night before surgery and morning of surgery  The night before surgery: Shower or bathe with warm water. Do not apply perfume, lotions, powders. Wait one hour after shower. Skin should be dry and cool. Open Sage wipe package - use 6 disposable cloths. Wipe body using one cloth for the right arm, one cloth for the left arm, one cloth for the right leg, one cloth for the left leg, one cloth for the chest/abdomen area, and one cloth for the back. Do not use on open wounds or sores. Do not use on face or genitals (private parts). If you are breast feeding, do not use on breasts. 5. Do not rinse, allow to dry. 6. Skin may feel  tacky for several minutes. 7. Dress in clean clothes. 8. Place clean sheets on your bed and do not sleep with pets.  REPEAT ABOVE ON THE MORNING OF SURGERY BEFORE ARRIVING TO THE HOSPITAL.   Merchandiser, retail to address health-related social needs:  [email or web address]

## 2024-02-26 ENCOUNTER — Encounter: Payer: Self-pay | Admitting: Surgery
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
## 2024-02-29 ENCOUNTER — Observation Stay
Admission: RE | Admit: 2024-02-29 | Discharge: 2024-03-01 | Disposition: A | Discharge status: Home / Self Care | Attending: Surgery | Admitting: Surgery

## 2024-02-29 ENCOUNTER — Ambulatory Visit: Payer: Self-pay | Admitting: Urgent Care

## 2024-02-29 ENCOUNTER — Encounter
Admission: RE | Disposition: A | Discharge status: Home / Self Care | Payer: Self-pay | Source: Home / Self Care | Attending: Surgery

## 2024-02-29 ENCOUNTER — Other Ambulatory Visit: Payer: Self-pay

## 2024-02-29 DIAGNOSIS — K8012 Calculus of gallbladder with acute and chronic cholecystitis without obstruction: Principal | ICD-10-CM | POA: Insufficient documentation

## 2024-02-29 DIAGNOSIS — M6281 Muscle weakness (generalized): Secondary | ICD-10-CM | POA: Diagnosis not present

## 2024-02-29 DIAGNOSIS — Z955 Presence of coronary angioplasty implant and graft: Secondary | ICD-10-CM | POA: Diagnosis not present

## 2024-02-29 DIAGNOSIS — I10 Essential (primary) hypertension: Secondary | ICD-10-CM | POA: Diagnosis not present

## 2024-02-29 DIAGNOSIS — Z79899 Other long term (current) drug therapy: Secondary | ICD-10-CM | POA: Diagnosis not present

## 2024-02-29 DIAGNOSIS — K810 Acute cholecystitis: Principal | ICD-10-CM | POA: Diagnosis present

## 2024-02-29 DIAGNOSIS — R262 Difficulty in walking, not elsewhere classified: Secondary | ICD-10-CM | POA: Insufficient documentation

## 2024-02-29 DIAGNOSIS — I2510 Atherosclerotic heart disease of native coronary artery without angina pectoris: Secondary | ICD-10-CM | POA: Diagnosis not present

## 2024-02-29 DIAGNOSIS — R1011 Right upper quadrant pain: Secondary | ICD-10-CM | POA: Diagnosis present

## 2024-02-29 HISTORY — PX: INDOCYANINE GREEN FLUORESCENCE IMAGING (ICG): SHX7595

## 2024-02-29 HISTORY — DX: Chronic ischemic heart disease, unspecified: I25.9

## 2024-02-29 HISTORY — DX: Atherosclerosis of aorta: I70.0

## 2024-02-29 HISTORY — DX: Unspecified systolic (congestive) heart failure: I50.20

## 2024-02-29 HISTORY — DX: Other hereditary and idiopathic neuropathies: G60.8

## 2024-02-29 HISTORY — DX: Fatty (change of) liver, not elsewhere classified: K76.0

## 2024-02-29 HISTORY — DX: Supraventricular tachycardia, unspecified: I47.10

## 2024-02-29 HISTORY — DX: Localized edema: R60.0

## 2024-02-29 HISTORY — DX: Long term (current) use of anticoagulants: Z79.01

## 2024-02-29 HISTORY — DX: Other intervertebral disc degeneration, thoracolumbar region: M51.35

## 2024-02-29 HISTORY — DX: Other cervical disc degeneration, unspecified cervical region: M50.30

## 2024-02-29 HISTORY — DX: Other specified health status: Z78.9

## 2024-02-29 HISTORY — DX: Other long term (current) drug therapy: Z79.899

## 2024-02-29 SURGERY — CHOLECYSTECTOMY, ROBOT-ASSISTED, LAPAROSCOPIC
Anesthesia: General | Site: Abdomen

## 2024-02-29 MED ORDER — ONDANSETRON HCL 4 MG/2ML IJ SOLN
INTRAMUSCULAR | Status: DC | PRN
  Administered 2024-02-29: 4 mg via INTRAVENOUS

## 2024-02-29 MED ORDER — BRIMONIDINE TARTRATE 0.2 % OP SOLN
1.0000 [drp] | Freq: Two times a day (BID) | OPHTHALMIC | Status: DC
  Administered 2024-02-29 – 2024-03-01 (×2): 1 [drp] via OPHTHALMIC
  Filled 2024-02-29: qty 5

## 2024-02-29 MED ORDER — KETAMINE HCL 50 MG/5ML IJ SOSY
PREFILLED_SYRINGE | INTRAMUSCULAR | Status: AC
  Filled 2024-02-29: qty 5

## 2024-02-29 MED ORDER — CHLORHEXIDINE GLUCONATE CLOTH 2 % EX PADS
6.0000 | MEDICATED_PAD | Freq: Once | CUTANEOUS | Status: AC
  Administered 2024-02-29: 6 via TOPICAL

## 2024-02-29 MED ORDER — ONDANSETRON HCL 4 MG/2ML IJ SOLN: 4.0000 mg | Freq: Four times a day (QID) | INTRAMUSCULAR | Status: DC | PRN

## 2024-02-29 MED ORDER — LACTATED RINGERS IV SOLN: INTRAVENOUS | Status: DC

## 2024-02-29 MED ORDER — METHOCARBAMOL 500 MG PO TABS: 500.0000 mg | ORAL_TABLET | Freq: Four times a day (QID) | ORAL | Status: DC | PRN

## 2024-02-29 MED ORDER — ONDANSETRON HCL 4 MG/2ML IJ SOLN: 4.0000 mg | Freq: Once | INTRAMUSCULAR | Status: DC | PRN

## 2024-02-29 MED ORDER — EPHEDRINE SULFATE-NACL 50-0.9 MG/10ML-% IV SOSY
PREFILLED_SYRINGE | INTRAVENOUS | Status: DC | PRN
  Administered 2024-02-29: 10 mg via INTRAVENOUS
  Administered 2024-02-29: 5 mg via INTRAVENOUS

## 2024-02-29 MED ORDER — BUPIVACAINE-EPINEPHRINE (PF) 0.5% -1:200000 IJ SOLN
INTRAMUSCULAR | Status: AC
  Filled 2024-02-29: qty 30

## 2024-02-29 MED ORDER — METOPROLOL TARTRATE 25 MG PO TABS
25.0000 mg | ORAL_TABLET | Freq: Two times a day (BID) | ORAL | Status: DC
  Administered 2024-02-29 – 2024-03-01 (×2): 25 mg via ORAL
  Filled 2024-02-29 (×2): qty 1

## 2024-02-29 MED ORDER — ACETAMINOPHEN 10 MG/ML IV SOLN
INTRAVENOUS | Status: DC | PRN
  Administered 2024-02-29: 1000 mg via INTRAVENOUS

## 2024-02-29 MED ORDER — DEXAMETHASONE SOD PHOSPHATE PF 10 MG/ML IJ SOLN
INTRAMUSCULAR | Status: DC | PRN
  Administered 2024-02-29: 10 mg via INTRAVENOUS

## 2024-02-29 MED ORDER — DOCUSATE SODIUM 100 MG PO CAPS: 100.0000 mg | ORAL_CAPSULE | Freq: Two times a day (BID) | ORAL | 0 refills | Status: AC | PRN

## 2024-02-29 MED ORDER — INDOCYANINE GREEN 25 MG IV SOLR
INTRAVENOUS | Status: AC
  Filled 2024-02-29: qty 10

## 2024-02-29 MED ORDER — CHLORHEXIDINE GLUCONATE 0.12 % MT SOLN
15.0000 mL | Freq: Once | OROMUCOSAL | Status: AC
  Administered 2024-02-29: 15 mL via OROMUCOSAL

## 2024-02-29 MED ORDER — LOSARTAN POTASSIUM 50 MG PO TABS
50.0000 mg | ORAL_TABLET | Freq: Every day | ORAL | Status: DC
  Administered 2024-02-29 – 2024-03-01 (×2): 50 mg via ORAL
  Filled 2024-02-29 (×2): qty 1

## 2024-02-29 MED ORDER — PHENYLEPHRINE 80 MCG/ML (10ML) SYRINGE FOR IV PUSH (FOR BLOOD PRESSURE SUPPORT)
PREFILLED_SYRINGE | INTRAVENOUS | Status: AC
  Filled 2024-02-29: qty 10

## 2024-02-29 MED ORDER — LIDOCAINE HCL (PF) 2 % IJ SOLN
INTRAMUSCULAR | Status: AC
  Filled 2024-02-29: qty 5

## 2024-02-29 MED ORDER — FENTANYL CITRATE (PF) 100 MCG/2ML IJ SOLN
INTRAMUSCULAR | Status: AC
  Filled 2024-02-29: qty 2

## 2024-02-29 MED ORDER — OXYCODONE HCL 5 MG/5ML PO SOLN: 5.0000 mg | Freq: Once | ORAL | Status: DC | PRN

## 2024-02-29 MED ORDER — INDOCYANINE GREEN 25 MG IV SOLR
1.2500 mg | Freq: Once | INTRAVENOUS | Status: AC
  Administered 2024-02-29: 1.25 mg via INTRAVENOUS

## 2024-02-29 MED ORDER — MORPHINE SULFATE (PF) 2 MG/ML IV SOLN: 1.0000 mg | INTRAVENOUS | Status: DC | PRN

## 2024-02-29 MED ORDER — ADULT MULTIVITAMIN W/MINERALS CH
ORAL_TABLET | Freq: Every day | ORAL | Status: DC
  Administered 2024-03-01: 1 via ORAL
  Filled 2024-02-29: qty 1

## 2024-02-29 MED ORDER — ACETAMINOPHEN 10 MG/ML IV SOLN: 1000.0000 mg | Freq: Once | INTRAVENOUS | Status: DC | PRN

## 2024-02-29 MED ORDER — ORAL CARE MOUTH RINSE: 15.0000 mL | Freq: Once | OROMUCOSAL | Status: AC

## 2024-02-29 MED ORDER — TAMSULOSIN HCL 0.4 MG PO CAPS
0.4000 mg | ORAL_CAPSULE | Freq: Every day | ORAL | Status: DC
  Administered 2024-02-29: 0.4 mg via ORAL
  Filled 2024-02-29: qty 1

## 2024-02-29 MED ORDER — AMIODARONE HCL 200 MG PO TABS
200.0000 mg | ORAL_TABLET | ORAL | Status: DC
  Administered 2024-03-01: 200 mg via ORAL
  Filled 2024-02-29: qty 1

## 2024-02-29 MED ORDER — ROCURONIUM BROMIDE 100 MG/10ML IV SOLN
INTRAVENOUS | Status: DC | PRN
  Administered 2024-02-29: 10 mg via INTRAVENOUS
  Administered 2024-02-29: 50 mg via INTRAVENOUS
  Administered 2024-02-29 (×2): 10 mg via INTRAVENOUS

## 2024-02-29 MED ORDER — EPHEDRINE 5 MG/ML INJ
INTRAVENOUS | Status: AC
  Filled 2024-02-29: qty 5

## 2024-02-29 MED ORDER — PHENYLEPHRINE 80 MCG/ML (10ML) SYRINGE FOR IV PUSH (FOR BLOOD PRESSURE SUPPORT)
PREFILLED_SYRINGE | INTRAVENOUS | Status: DC | PRN
  Administered 2024-02-29: 80 ug via INTRAVENOUS
  Administered 2024-02-29: 160 ug via INTRAVENOUS
  Administered 2024-02-29: 80 ug via INTRAVENOUS
  Administered 2024-02-29: 160 ug via INTRAVENOUS

## 2024-02-29 MED ORDER — CEFAZOLIN SODIUM-DEXTROSE 2-4 GM/100ML-% IV SOLN
2.0000 g | INTRAVENOUS | Status: AC
  Administered 2024-02-29: 2 g via INTRAVENOUS

## 2024-02-29 MED ORDER — LORATADINE 10 MG PO TABS
10.0000 mg | ORAL_TABLET | ORAL | Status: DC
  Administered 2024-02-29: 10 mg via ORAL
  Filled 2024-02-29 (×2): qty 1

## 2024-02-29 MED ORDER — DORZOLAMIDE HCL-TIMOLOL MAL 2-0.5 % OP SOLN
1.0000 [drp] | Freq: Two times a day (BID) | OPHTHALMIC | Status: DC
  Administered 2024-02-29 – 2024-03-01 (×2): 1 [drp] via OPHTHALMIC
  Filled 2024-02-29: qty 10

## 2024-02-29 MED ORDER — KETAMINE HCL 50 MG/5ML IJ SOSY
PREFILLED_SYRINGE | INTRAMUSCULAR | Status: DC | PRN
  Administered 2024-02-29: 20 mg via INTRAVENOUS

## 2024-02-29 MED ORDER — FENTANYL CITRATE (PF) 100 MCG/2ML IJ SOLN
25.0000 ug | INTRAMUSCULAR | Status: DC | PRN
  Administered 2024-02-29 (×2): 25 ug via INTRAVENOUS

## 2024-02-29 MED ORDER — VITAMIN D 25 MCG (1000 UNIT) PO TABS
1000.0000 [IU] | ORAL_TABLET | Freq: Every day | ORAL | Status: DC
  Administered 2024-02-29 – 2024-03-01 (×2): 1000 [IU] via ORAL
  Filled 2024-02-29 (×2): qty 1

## 2024-02-29 MED ORDER — BUPIVACAINE-EPINEPHRINE (PF) 0.5% -1:200000 IJ SOLN
INTRAMUSCULAR | Status: DC | PRN
  Administered 2024-02-29: 30 mL

## 2024-02-29 MED ORDER — ROCURONIUM BROMIDE 10 MG/ML (PF) SYRINGE
PREFILLED_SYRINGE | INTRAVENOUS | Status: AC
  Filled 2024-02-29: qty 10

## 2024-02-29 MED ORDER — LIDOCAINE HCL (CARDIAC) PF 100 MG/5ML IV SOSY
PREFILLED_SYRINGE | INTRAVENOUS | Status: DC | PRN
  Administered 2024-02-29: 80 mg via INTRAVENOUS

## 2024-02-29 MED ORDER — DIMENHYDRINATE 50 MG PO TABS: 50.0000 mg | ORAL_TABLET | Freq: Every evening | ORAL | Status: DC | PRN

## 2024-02-29 MED ORDER — PANTOPRAZOLE SODIUM 40 MG PO TBEC
40.0000 mg | DELAYED_RELEASE_TABLET | Freq: Every day | ORAL | Status: DC
  Administered 2024-02-29 – 2024-03-01 (×2): 40 mg via ORAL
  Filled 2024-02-29 (×2): qty 1

## 2024-02-29 MED ORDER — PROPOFOL 10 MG/ML IV BOLUS
INTRAVENOUS | Status: DC | PRN
  Administered 2024-02-29: 150 mg via INTRAVENOUS

## 2024-02-29 MED ORDER — CHLORHEXIDINE GLUCONATE 0.12 % MT SOLN
OROMUCOSAL | Status: AC
  Filled 2024-02-29: qty 15

## 2024-02-29 MED ORDER — TRAMADOL HCL 50 MG PO TABS: 50.0000 mg | ORAL_TABLET | Freq: Four times a day (QID) | ORAL | Status: DC | PRN

## 2024-02-29 MED ORDER — ONDANSETRON HCL 4 MG/2ML IJ SOLN
INTRAMUSCULAR | Status: AC
  Filled 2024-02-29: qty 2

## 2024-02-29 MED ORDER — ROSUVASTATIN CALCIUM 10 MG PO TABS
20.0000 mg | ORAL_TABLET | Freq: Every day | ORAL | Status: DC
  Administered 2024-02-29: 20 mg via ORAL
  Filled 2024-02-29: qty 2

## 2024-02-29 MED ORDER — PREGABALIN 50 MG PO CAPS
50.0000 mg | ORAL_CAPSULE | Freq: Two times a day (BID) | ORAL | Status: DC
  Administered 2024-02-29 – 2024-03-01 (×2): 50 mg via ORAL
  Filled 2024-02-29 (×2): qty 1

## 2024-02-29 MED ORDER — FENTANYL CITRATE (PF) 100 MCG/2ML IJ SOLN
INTRAMUSCULAR | Status: DC | PRN
  Administered 2024-02-29 (×3): 50 ug via INTRAVENOUS

## 2024-02-29 MED ORDER — SUGAMMADEX SODIUM 200 MG/2ML IV SOLN
INTRAVENOUS | Status: DC | PRN
  Administered 2024-02-29: 200 mg via INTRAVENOUS

## 2024-02-29 MED ORDER — ACETAMINOPHEN 500 MG PO TABS: 500.0000 mg | ORAL_TABLET | Freq: Four times a day (QID) | ORAL | Status: DC | PRN

## 2024-02-29 MED ORDER — OXYCODONE HCL 5 MG PO TABS: 5.0000 mg | ORAL_TABLET | Freq: Once | ORAL | Status: DC | PRN

## 2024-02-29 MED ORDER — 0.9 % SODIUM CHLORIDE (POUR BTL) OPTIME
TOPICAL | Status: DC | PRN
  Administered 2024-02-29: 500 mL

## 2024-02-29 MED ORDER — CEFAZOLIN SODIUM-DEXTROSE 2-4 GM/100ML-% IV SOLN
INTRAVENOUS | Status: AC
  Filled 2024-02-29: qty 100

## 2024-02-29 MED ORDER — ONDANSETRON 4 MG PO TBDP: 4.0000 mg | ORAL_TABLET | Freq: Four times a day (QID) | ORAL | Status: DC | PRN

## 2024-02-29 MED ORDER — ACETAMINOPHEN 10 MG/ML IV SOLN
INTRAVENOUS | Status: AC
  Filled 2024-02-29: qty 100

## 2024-02-29 SURGICAL SUPPLY — 45 items
ANCHOR TIS RET SYS 235ML (MISCELLANEOUS) ×2 IMPLANT
BAG PRESSURE INF REUSE 1000 (BAG) IMPLANT
CAUTERY HOOK MNPLR 1.6 DVNC XI (INSTRUMENTS) ×2 IMPLANT
CLIP LIGATING HEM O LOK PURPLE (MISCELLANEOUS) IMPLANT
CLIP LIGATING HEMO O LOK GREEN (MISCELLANEOUS) ×2 IMPLANT
DEFOGGER SCOPE WARM SEASHARP (MISCELLANEOUS) ×2 IMPLANT
DERMABOND ADVANCED .7 DNX12 (GAUZE/BANDAGES/DRESSINGS) ×2 IMPLANT
DRAIN CHANNEL JP 15F RND 3/16 (MISCELLANEOUS) IMPLANT
DRAPE ARM DVNC X/XI (DISPOSABLE) ×8 IMPLANT
DRAPE C-ARM XRAY 36X54 (DRAPES) IMPLANT
DRAPE COLUMN DVNC XI (DISPOSABLE) ×2 IMPLANT
DRSG TEGADERM 4X4.75 (GAUZE/BANDAGES/DRESSINGS) IMPLANT
ELECTRODE REM PT RTRN 9FT ADLT (ELECTROSURGICAL) ×2 IMPLANT
EVACUATOR SILICONE 100CC (DRAIN) IMPLANT
FORCEPS BPLR FENES DVNC XI (FORCEP) ×2 IMPLANT
FORCEPS PROGRASP DVNC XI (FORCEP) ×2 IMPLANT
GLOVE BIOGEL PI IND STRL 7.0 (GLOVE) ×4 IMPLANT
GLOVE SURG SYN 6.5 PF PI (GLOVE) ×8 IMPLANT
GOWN STRL REUS W/ TWL LRG LVL3 (GOWN DISPOSABLE) ×8 IMPLANT
GRASPER SUT TROCAR 14GX15 (MISCELLANEOUS) IMPLANT
IRRIGATOR SUCT 8 DISP DVNC XI (IRRIGATION / IRRIGATOR) IMPLANT
IV 0.9% NACL 1000 ML (IV SOLUTION) IMPLANT
KIT TURNOVER KIT A (KITS) ×2 IMPLANT
LABEL OR SOLS (LABEL) ×2 IMPLANT
MANIFOLD NEPTUNE II (INSTRUMENTS) ×2 IMPLANT
NDL HYPO 22X1.5 SAFETY MO (MISCELLANEOUS) ×2 IMPLANT
NDL INSUFFLATION 14GA 120MM (NEEDLE) ×2 IMPLANT
NEEDLE HYPO 22X1.5 SAFETY MO (MISCELLANEOUS) ×2 IMPLANT
NEEDLE INSUFFLATION 14GA 120MM (NEEDLE) ×2 IMPLANT
NS IRRIG 500ML POUR BTL (IV SOLUTION) ×2 IMPLANT
OBTURATOR OPTICALSTD 8 DVNC (TROCAR) ×2 IMPLANT
PACK LAP CHOLECYSTECTOMY (MISCELLANEOUS) ×2 IMPLANT
SEAL UNIV 5-12 XI (MISCELLANEOUS) ×8 IMPLANT
SEALER VESSEL EXT DVNC XI (MISCELLANEOUS) IMPLANT
SET TUBE SMOKE EVAC HIGH FLOW (TUBING) ×2 IMPLANT
SOLUTION ELECTROSURG ANTI STCK (MISCELLANEOUS) ×2 IMPLANT
SPIKE FLUID TRANSFER (MISCELLANEOUS) ×4 IMPLANT
SPONGE DRAIN TRACH 4X4 STRL 2S (GAUZE/BANDAGES/DRESSINGS) IMPLANT
SUT VICRYL 0 UR6 27IN ABS (SUTURE) ×2 IMPLANT
SUTURE MNCRL 4-0 27XMF (SUTURE) ×4 IMPLANT
SYR 30ML LL (SYRINGE) IMPLANT
SYSTEM WECK SHIELD CLOSURE (TROCAR) IMPLANT
TRAP FLUID SMOKE EVACUATOR (MISCELLANEOUS) ×2 IMPLANT
ULTRASOUND BK UROLOGY PROCEDUR (MISCELLANEOUS) IMPLANT
WATER STERILE IRR 500ML POUR (IV SOLUTION) ×2 IMPLANT

## 2024-02-29 NOTE — Discharge Instructions (Signed)
 Laparoscopic Cholecystectomy, Care After This sheet gives you information about how to care for yourself after your procedure. Your doctor may also give you more specific instructions. If you have problems or questions, contact your doctor. Follow these instructions at home: Care for cuts from surgery (incisions)  Follow instructions from your doctor about how to take care of your cuts from surgery. Make sure you: Wash your hands with soap and water before you change your bandage (dressing). If you cannot use soap and water, use hand sanitizer. Change your bandage as told by your doctor. Leave stitches (sutures), skin glue, or skin tape (adhesive) strips in place. They may need to stay in place for 2 weeks or longer. If tape strips get loose and curl up, you may trim the loose edges. Do not remove tape strips completely unless your doctor says it is okay. Do not take baths, swim, or use a hot tub until your doctor says it is okay. OK TO SHOWER 24HRS AFTER YOUR SURGERY.  Check your surgical cut area every day for signs of infection. Check for: More redness, swelling, or pain. More fluid or blood. Warmth. Pus or a bad smell. Activity Do not drive or use heavy machinery while taking prescription pain medicine. Do not play contact sports until your doctor says it is okay. Do not drive for 24 hours if you were given a medicine to help you relax (sedative). Rest as needed. Do not return to work or school until your doctor says it is okay. General instructions  tylenol  as needed for discomfort.    Use narcotics, if prescribed, only when tylenolis not enough to control pain.  325-650mg  every 8hrs to max of 3000mg /24hrs for the tylenol .   Resume Plavix  and Eliquis  in 48 hours, ,MONDAY 03/03/24 To prevent or treat constipation while you are taking prescription pain medicine, your doctor may recommend that you: Drink enough fluid to keep your pee (urine) clear or pale yellow. Take over-the-counter or  prescription medicines. Eat foods that are high in fiber, such as fresh fruits and vegetables, whole grains, and beans. Limit foods that are high in fat and processed sugars, such as fried and sweet foods. Contact a doctor if: You develop a rash. You have more redness, swelling, or pain around your surgical cuts. You have more fluid or blood coming from your surgical cuts. Your surgical cuts feel warm to the touch. You have pus or a bad smell coming from your surgical cuts. You have a fever. One or more of your surgical cuts breaks open. You have trouble breathing. You have chest pain. You have pain that is getting worse in your shoulders. You faint or feel dizzy when you stand. You have very bad pain in your belly (abdomen). You are sick to your stomach (nauseous) for more than one day. You have throwing up (vomiting) that lasts for more than one day. You have leg pain. This information is not intended to replace advice given to you by your health care provider. Make sure you discuss any questions you have with your health care provider. Document Released: 01/25/2008 Document Revised: 11/06/2015 Document Reviewed: 10/04/2015 Elsevier Interactive Patient Education  2019 [employer].

## 2024-02-29 NOTE — Anesthesia Preprocedure Evaluation (Addendum)
 Anesthesia Evaluation  Patient identified by MRN, date of birth, ID band Patient awake    Reviewed: Allergy & Precautions, NPO status , Patient's Chart, lab work & pertinent test results  History of Anesthesia Complications Negative for: history of anesthetic complications  Airway Mallampati: I   Neck ROM: Full    Dental  (+) Missing   Pulmonary neg pulmonary ROS   Pulmonary exam normal breath sounds clear to auscultation       Cardiovascular hypertension, + CAD (s/p stents on Plavix )  Normal cardiovascular exam+ dysrhythmias (a fib on Eliquis )  Rhythm:Regular Rate:Normal  ECG 12/25/23: Sinus tachycardia; PACs; LAFB; low voltage; baseline wander in V1; no STEMI  Echo 12/26/23:  1. Left ventricular ejection fraction, by estimation, is 45 to 50%. The left ventricle has mildly decreased function. The left ventricle has no regional wall motion abnormalities. Left ventricular diastolic parameters were normal.  2. Right ventricular systolic function is normal. The right ventricular size is normal.  3. The mitral valve is normal in structure. Mild mitral valve regurgitation. No evidence of mitral stenosis.  4. The aortic valve is normal in structure. Aortic valve regurgitation is not visualized. No aortic stenosis is present.  5. The inferior vena cava is normal in size with greater than 50% respiratory variability, suggesting right atrial pressure of 3 mmHg.      Neuro/Psych  Neuromuscular disease (peripheral neuropathy)    GI/Hepatic ,GERD  ,,  Endo/Other  negative endocrine ROS    Renal/GU negative Renal ROS   BPH    Musculoskeletal  (+) Arthritis ,  Gout    Abdominal   Peds  Hematology negative hematology ROS (+)   Anesthesia Other Findings Reviewed and agree with Dorise Boor pre-anesthesia clinical review note.    Cardiology note 01/10/24:  83 y.o. M with   -paroxysmal AF -chronic anticoagulation -current use  of amiodarone  -CAD s/p PCI to LAD 2006 -HTN -HLD -B carotid atherosclerosis -ASA allergy -coronary artery Ca++ seen on CT  Plan   -chronic illness w/ progression/exacerbation: AF with RVR, recent hospitalization -external notes reviewed: notes from hospitalization at [facility] last month -tests ordered and interpreted today: ECG showing NSR, first deg AVB, poor Rw progression -independent interpretation of test performed by another MD: echo performed at [facility] 12/26/23, hepatobiliary scan 12/27/23 -remains in sinus today -continue eliquis , amiodarone  (high risk medication requiring monitoring, will obtain LFTs, TSH today) -continue plavix  given h/o CAD s/p PCI -continue current antiHTN rx -f/u six mos    Reproductive/Obstetrics                              Anesthesia Physical Anesthesia Plan  ASA: 3  Anesthesia Plan: General   Post-op Pain Management:    Induction: Intravenous  PONV Risk Score and Plan: 2 and Ondansetron , Dexamethasone and Treatment may vary due to age or medical condition  Airway Management Planned: Oral ETT  Additional Equipment:   Intra-op Plan:   Post-operative Plan: Extubation in OR  Informed Consent: I have reviewed the patients History and Physical, chart, labs and discussed the procedure including the risks, benefits and alternatives for the proposed anesthesia with the patient or authorized representative who has indicated his/her understanding and acceptance.     Dental advisory given  Plan Discussed with: CRNA  Anesthesia Plan Comments: (Patient consented for risks of anesthesia including but not limited to:  - adverse reactions to medications - damage to eyes, teeth, lips or other  oral mucosa - nerve damage due to positioning  - sore throat or hoarseness - damage to heart, brain, nerves, lungs, other parts of body or loss of life  Informed patient about role of CRNA in peri- and intra-operative care.  Patient  voiced understanding.)         Anesthesia Quick Evaluation

## 2024-02-29 NOTE — Interval H&P Note (Signed)
 No change. Ok to proceed

## 2024-02-29 NOTE — Interval H&P Note (Signed)
 No change. OK to proceed.

## 2024-02-29 NOTE — Anesthesia Procedure Notes (Signed)
 Procedure Name: Intubation Date/Time: 02/29/2024 1:05 PM  Performed by: Veronica Alm BROCKS, CRNAPre-anesthesia Checklist: Patient identified, Emergency Drugs available, Suction available and Patient being monitored Patient Re-evaluated:Patient Re-evaluated prior to induction Oxygen Delivery Method: Circle system utilized Preoxygenation: Pre-oxygenation with 100% oxygen Induction Type: IV induction Ventilation: Mask ventilation without difficulty Laryngoscope Size: McGrath and 4 Grade View: Grade I Tube type: Oral Tube size: 7.5 mm Number of attempts: 1 Airway Equipment and Method: Stylet and Oral airway Placement Confirmation: ETT inserted through vocal cords under direct vision, positive ETCO2 and breath sounds checked- equal and bilateral Secured at: 22 cm Tube secured with: Tape Dental Injury: Teeth and Oropharynx as per pre-operative assessment

## 2024-02-29 NOTE — Transfer of Care (Signed)
 Immediate Anesthesia Transfer of Care Note  Patient: Shawn Hart  Procedure(s) Performed: CHOLECYSTECTOMY, ROBOT-ASSISTED, LAPAROSCOPIC (Abdomen) INDOCYANINE GREEN FLUORESCENCE IMAGING (ICG)  Patient Location: PACU  Anesthesia Type:General  Level of Consciousness: drowsy  Airway & Oxygen Therapy: Patient Spontanous Breathing and Patient connected to face mask oxygen  Post-op Assessment: Report given to RN and Post -op Vital signs reviewed and stable  Post vital signs: Reviewed and stable  Last Vitals:  Vitals Value Taken Time  BP 135/77 02/29/24 15:35  Temp    Pulse 65 02/29/24 15:36  Resp 16 02/29/24 15:36  SpO2 97 % 02/29/24 15:36  Vitals shown include unfiled device data.  Last Pain:  Vitals:   02/29/24 1244  TempSrc: Temporal         Complications: No notable events documented.

## 2024-02-29 NOTE — Anesthesia Postprocedure Evaluation (Signed)
 Anesthesia Post Note  Patient: Shawn Hart  Procedure(s) Performed: CHOLECYSTECTOMY, ROBOT-ASSISTED, LAPAROSCOPIC (Abdomen) INDOCYANINE GREEN FLUORESCENCE IMAGING (ICG)  Patient location during evaluation: PACU Anesthesia Type: General Level of consciousness: awake and alert, oriented and patient cooperative Pain management: pain level controlled Vital Signs Assessment: post-procedure vital signs reviewed and stable Respiratory status: spontaneous breathing, nonlabored ventilation and respiratory function stable Cardiovascular status: blood pressure returned to baseline and stable Postop Assessment: adequate PO intake Anesthetic complications: no   No notable events documented.   Last Vitals:  Vitals:   02/29/24 1244 02/29/24 1537  BP: (!) 155/87 135/77  Pulse: 66 65  Resp: 20 16  Temp: (!) 36.1 C (!) 36.2 C  SpO2: 95% 97%    Last Pain:  Vitals:   02/29/24 1537  TempSrc:   PainSc: Asleep                 Alfonso Ruths

## 2024-03-01 ENCOUNTER — Encounter: Payer: Self-pay | Admitting: Surgery

## 2024-03-01 DIAGNOSIS — K8012 Calculus of gallbladder with acute and chronic cholecystitis without obstruction: Secondary | ICD-10-CM | POA: Diagnosis not present

## 2024-03-01 LAB — BASIC METABOLIC PANEL WITH GFR
Anion gap: 8 (ref 5–15)
BUN: 14 mg/dL (ref 8–23)
CO2: 24 mmol/L (ref 22–32)
Calcium: 8.5 mg/dL — ABNORMAL LOW (ref 8.9–10.3)
Chloride: 99 mmol/L (ref 98–111)
Creatinine, Ser: 0.88 mg/dL (ref 0.61–1.24)
GFR, Estimated: >60 mL/min (ref >60)
Glucose, Bld: 138 mg/dL — ABNORMAL HIGH (ref 70–99)
Potassium: 3.9 mmol/L (ref 3.5–5.1)
Sodium: 131 mmol/L — ABNORMAL LOW (ref 135–145)

## 2024-03-01 LAB — HEPATIC FUNCTION PANEL
ALT: 41 U/L (ref 0–44)
AST: 40 U/L (ref 15–41)
Albumin: 3.4 g/dL — ABNORMAL LOW (ref 3.5–5.0)
Alkaline Phosphatase: 62 U/L (ref 38–126)
Bilirubin, Direct: 0.3 mg/dL — ABNORMAL HIGH (ref 0.0–0.2)
Indirect Bilirubin: 0.7 mg/dL (ref 0.3–0.9)
Total Bilirubin: 1 mg/dL (ref 0.0–1.2)
Total Protein: 6.6 g/dL (ref 6.5–8.1)

## 2024-03-01 LAB — CBC
HCT: 41.7 % (ref 39.0–52.0)
Hemoglobin: 13.8 g/dL (ref 13.0–17.0)
MCH: 29.4 pg (ref 26.0–34.0)
MCHC: 33.1 g/dL (ref 30.0–36.0)
MCV: 88.7 fL (ref 80.0–100.0)
Platelets: 196 K/uL (ref 150–400)
RBC: 4.7 MIL/uL (ref 4.22–5.81)
RDW: 13.4 % (ref 11.5–15.5)
WBC: 15.6 K/uL — ABNORMAL HIGH (ref 4.0–10.5)
nRBC: 0 % (ref 0.0–0.2)

## 2024-03-01 NOTE — Progress Notes (Signed)
 Patient discharging home. Educated patient on how to empty the JP drain.Patient demonstrated understanding.

## 2024-03-01 NOTE — Care Management Obs Status (Signed)
 MEDICARE OBSERVATION STATUS NOTIFICATION   Patient Details  Name: Shawn Hart MRN: [id number] Date of Birth: 04-28-1941   Medicare Observation Status Notification Given:  No patient admitted    [name] 03/01/2024, 11:33 AM

## 2024-03-01 NOTE — Op Note (Signed)
 Preoperative diagnosis:  chronic and cholecystitis  Postoperative diagnosis: same as above plus colon mesentary injury  Procedure: Robotic assisted Laparoscopic Cholecystectomy.   Anesthesia: GETA   Surgeon: Henriette Pierre  Specimen: Gallbladder  Complications: None  EBL: 45mL  Wound Classification: Clean Contaminated  Indications: see HPI  Findings: Cholecystostomy tube noted to be entering through transverse colon mesentary and omentum into gallbladder Critical view of safety noted Cystic duct and artery identified, ligated and divided, clips remained intact at end of procedure Adequate hemostasis  Description of procedure:  The patient was placed on the operating table in the supine position. SCDs placed, pre-op abx administered.  General anesthesia was induced and OG tube placed by anesthesia. A time-out was completed verifying correct patient, procedure, site, positioning, and implant(s) and/or special equipment prior to beginning this procedure. The abdomen was prepped and draped in the usual sterile fashion.    Veress needle was placed at the Palmer's point and insufflation was started after confirming a positive saline drop test and no immediate increase in abdominal pressure.  After reaching 15 mm, the Veress needle was removed and a 8 mm port was placed via optiview technique under umbilicus measured 20mm from gallbladder.  The abdomen was inspected and no abnormalities or injuries were found.  Under direct vision, ports were placed in the following locations: One 12 mm patient left of the umbilicus, 8cm from the periumbilical port, one 8 mm port placed to the patient right of the umbilical port 8 cm apart.  1 additional 8 mm port placed lateral to the 12mm port.  Once ports were placed, The table was placed in the reverse Trendelenburg position with the right side up. The Xi platform was brought into the operative field and docked to the ports successfully.  An endoscope was  placed through the umbilical port, fenestrated grasper through the adjacent patient right port, prograsp to the far patient left port, and then a hook cautery in the left port.  Inspection RUQ noted cholecystostomy tube previously placed surrounded by omentum, and colon.  Dissected initiated around the tube from the anterior wall and colon was noted to wrap behind the tube, with no visualization of gallbladder or liver.  Concern for tube inadvertenly placed through colon and/or its mesentary and omentum noted.  Additional dissection carried out with scissors and vessel sealer to first transect the omentum around visible tube, and once retracted, tube confirmed to be penetrating colon mensentary, with the colon itself laying on top of liver and behind the visible tube.  Decision made at this point to remove the tube to facilitate better visualization of gallbladder itself.  Tube removed from bedside with no resistance under direct visualization.  Colon and mesentary noted to slide off gallbladder after tube removal, and inspection of former tube site and mesentary did not note any obvious bleeding, nor injury to the colon serosa itself.  Decision made to proceed with cholecystectomy as orginally planned.  The dome of the gallbladder was grasped with prograsp, passed and retracted over the dome of the liver. Additional adhesions between the gallbladder and omentum, duodenum and transverse colon were lysed via hook cautery. visualization of the area somewhat difficult to maintain due to the large and friable liver as well as the very thick omentum that intermittently slide into operative field.  The infundibulum was grasped with the fenestrated grasper and retracted toward the right lower quadrant. This maneuver exposed Calot's triangle. The peritoneum overlying the gallbladder infundibulum was then dissected  and the  cystic duct and cystic artery identified.  Critical view of safety with the liver bed clearly  visible behind the duct and artery with no additional structures noted.  The cystic duct and cystic artery clipped and divided close to the gallbladder.     The gallbladder was then dissected from its peritoneal and liver bed attachments by electrocautery. Small tears in the liver from retraction as well as retractions with the prograsp controlled with electrocautery. Hemostasis was checked prior to removing the hook cautery and the Endo Catch bag was then placed through the 12 mm port and the gallbladder was removed. 15Fr round drain placed within area through RLQ port and secured to skin using 3-0 nylon.  The gallbladder was passed off the table as a specimen. There was no evidence of bleeding from the gallbladder fossa or cystic artery or leakage of the bile from the cystic duct stump. The 12 mm port site closed with M-close device using 0 vicryl under direct vision.  Abdomen desufflated and secondary trocars were removed under direct vision. No bleeding was noted. All skin incisions then closed with subcuticular sutures of 4-0 monocryl and dressed with topical skin adhesive. The orogastric tube was removed and patient extubated.  The patient tolerated the procedure well and was taken to the postanesthesia care unit in stable condition.  All sponge and instrument count correct at end of procedure.  Due to unexpected colon mesentary finding as well as chronic inflammation and bleeding encountered during procedure, pt will be admitted overnight for observation
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
## 2024-03-01 NOTE — Discharge Summary (Signed)
 Patient ID: Shawn Hart MRN: [id number] DOB/AGE: 1940-06-06 83 y.o.  Admit date: 02/29/2024 Discharge date: 03/01/2024   Discharge Diagnoses:  Principal Problem:   Acute cholecystitis   Procedures:Robotic assisted cholecystectomy  Hospital Course:   admitted with findings consistent with history of acute cholecystitis had a cholecystostomy drain.  He was taken to the operating room for robotic assisted cholecystectomy.  His cholecystostomy tube was noted to be going through the transverse colon mesentery into the gallbladder.  Given this and the degree of dissection needed for the case he was kept overnight.  On postoperative day 1 he was tolerating a liquid diet and advance to a soft diet.  He tolerated this well without any nausea or vomiting.  His drain was low output though sanguinous.  Physical therapy was consulted and they did not recommend any further equipment that he needed at home.  At time of discharge his pain was well-controlled he was tolerating a diet and having bowel function.       Consults: PT  Disposition: Discharge disposition: 01-Home or Self Care       Discharge Instructions     Call MD for:  difficulty breathing, headache or visual disturbances   Complete by: As directed    Call MD for:  extreme fatigue   Complete by: As directed    Call MD for:  hives   Complete by: As directed    Call MD for:  persistant dizziness or light-headedness   Complete by: As directed    Call MD for:  persistant nausea and vomiting   Complete by: As directed    Call MD for:  redness, tenderness, or signs of infection (pain, swelling, redness, odor or green/yellow discharge around incision site)   Complete by: As directed    Call MD for:  severe uncontrolled pain   Complete by: As directed    Call MD for:  temperature >100.4   Complete by: As directed    Diet - low sodium heart healthy   Complete by: As directed    Increase activity slowly   Complete by: As  directed       Allergies as of 03/01/2024       Reactions   Aspirin Swelling, Anaphylaxis   Other reaction(s): Angioedema of tongue   Atorvastatin    Other reaction(s): Unknown   Gabapentin Other (See Comments)   Nightmares when taking   Pravastatin    Other reaction(s): Muscle pain   Vytorin [ezetimibe-simvastatin]    Other reaction(s): Unknown        Medication List     TAKE these medications    acetaminophen  500 MG tablet Commonly known as: TYLENOL  Take 1 tablet (500 mg total) by mouth every 6 (six) hours as needed for headache, fever, mild pain (pain score 1-3) or moderate pain (pain score 4-6).   amiodarone  200 MG tablet Commonly known as: PACERONE  Take 200 mg by mouth every morning.   brimonidine  0.2 % ophthalmic solution Commonly known as: ALPHAGAN  Place 1 drop into the right eye 2 (two) times daily.   cholecalciferol 25 MCG (1000 UNIT) tablet Commonly known as: VITAMIN D3 Take 1,000 Units by mouth daily.   clopidogrel  75 MG tablet Commonly known as: PLAVIX  Take 75 mg by mouth daily. Notes to patient:  Resume Plavix  and Eliquis  in 48 hours, ,MONDAY 03/03/24   cyanocobalamin  1000 MCG tablet Commonly known as: VITAMIN B12 Take 1,000 mcg by mouth daily.   dimenhyDRINATE 50 MG tablet Commonly known as:  DRAMAMINE Take 50 mg by mouth at bedtime as needed for dizziness.   docusate sodium 100 MG capsule Commonly known as: Colace Take 1 capsule (100 mg total) by mouth 2 (two) times daily as needed for up to 10 days for mild constipation.   dorzolamide -timolol  2-0.5 % ophthalmic solution Commonly known as: COSOPT  Place 1 drop into both eyes 2 (two) times daily.   Eliquis  5 MG Tabs tablet Generic drug: apixaban  Take 5 mg by mouth 2 (two) times daily. Notes to patient:  Resume Plavix  and Eliquis  in 48 hours, ,MONDAY 03/03/24   esomeprazole 40 MG capsule Commonly known as: NEXIUM Take 40 mg by mouth every morning.   loratadine  10 MG tablet Commonly  known as: CLARITIN  Take 10 mg by mouth every other day.   losartan  50 MG tablet Commonly known as: COZAAR  Take 1 tablet (50 mg total) by mouth daily.   methocarbamol  500 MG tablet Commonly known as: ROBAXIN  Take 500 mg by mouth as needed for muscle spasms.   metoprolol  tartrate 25 MG tablet Commonly known as: LOPRESSOR  Take 1 tablet (25 mg total) by mouth 2 (two) times daily.   multivitamin tablet Take 1 tablet by mouth daily.   pregabalin  50 MG capsule Commonly known as: LYRICA  Take 50 mg by mouth 2 (two) times daily.   rosuvastatin  20 MG tablet Commonly known as: CRESTOR  Take 20 mg by mouth at bedtime.   tamsulosin  0.4 MG Caps capsule Commonly known as: FLOMAX  Take 1 capsule (0.4 mg total) by mouth daily after supper.   traMADol  50 MG tablet Commonly known as: ULTRAM  Take by mouth every 6 (six) hours as needed.   UNABLE TO FIND Take 1 tablet by mouth 3 (three) times daily after meals. Compound medication (Golo Release Dietary)   VITAMIN C  PO Take 500 mg by mouth daily.        Follow-up Information     Tye, Isami, DO Follow up in 2 week(s).   Specialties: General Surgery, Surgery Why: post op lap chole Contact information: [address] [place] [address] [phone]                  Jayson Endow, M.D.

## 2024-03-04 LAB — SURGICAL PATHOLOGY
